# Patient Record
Sex: Female | Born: 1990
Health system: Southern US, Community
[De-identification: ages and names within clinical notes are randomized; demographics above are authoritative.]

## PROBLEM LIST (undated history)

## (undated) ENCOUNTER — Inpatient Hospital Stay (HOSPITAL_COMMUNITY): Payer: Self-pay

## (undated) ENCOUNTER — Emergency Department (HOSPITAL_COMMUNITY): Discharge status: Absent without leave | Payer: Self-pay

## (undated) DIAGNOSIS — A6000 Herpesviral infection of urogenital system, unspecified: Secondary | ICD-10-CM

## (undated) HISTORY — PX: BREAST SURGERY: SHX581

## (undated) HISTORY — PX: WISDOM TOOTH EXTRACTION: SHX21

## (undated) HISTORY — PX: BREAST ENHANCEMENT SURGERY: SHX7

---
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
## 2010-11-30 ENCOUNTER — Encounter: Payer: Self-pay | Admitting: *Deleted

## 2010-11-30 ENCOUNTER — Emergency Department (HOSPITAL_BASED_OUTPATIENT_CLINIC_OR_DEPARTMENT_OTHER)
Admission: EM | Admit: 2010-11-30 | Discharge: 2010-12-01 | Disposition: A | Discharge status: Home / Self Care | Payer: PRIVATE HEALTH INSURANCE | Attending: Emergency Medicine | Admitting: Emergency Medicine

## 2010-11-30 ENCOUNTER — Emergency Department (INDEPENDENT_AMBULATORY_CARE_PROVIDER_SITE_OTHER): Payer: PRIVATE HEALTH INSURANCE

## 2010-11-30 DIAGNOSIS — M545 Low back pain, unspecified: Secondary | ICD-10-CM | POA: Insufficient documentation

## 2010-11-30 DIAGNOSIS — M549 Dorsalgia, unspecified: Secondary | ICD-10-CM

## 2010-11-30 DIAGNOSIS — N12 Tubulo-interstitial nephritis, not specified as acute or chronic: Secondary | ICD-10-CM | POA: Insufficient documentation

## 2010-11-30 DIAGNOSIS — R109 Unspecified abdominal pain: Secondary | ICD-10-CM | POA: Insufficient documentation

## 2010-11-30 DIAGNOSIS — R1031 Right lower quadrant pain: Secondary | ICD-10-CM

## 2010-11-30 DIAGNOSIS — R110 Nausea: Secondary | ICD-10-CM

## 2010-11-30 LAB — DIFFERENTIAL
Basophils Relative: 0 % (ref 0–1)
Eosinophils Absolute: 0 10*3/uL (ref 0.0–0.7)
Lymphs Abs: 2 10*3/uL (ref 0.7–4.0)
Neutro Abs: 8.4 10*3/uL — ABNORMAL HIGH (ref 1.7–7.7)
Neutrophils Relative %: 74 % (ref 43–77)

## 2010-11-30 LAB — URINALYSIS, ROUTINE W REFLEX MICROSCOPIC
Bilirubin Urine: NEGATIVE
Leukocytes, UA: NEGATIVE
Nitrite: NEGATIVE
Specific Gravity, Urine: 1.022 (ref 1.005–1.030)
Urobilinogen, UA: 1 mg/dL (ref 0.0–1.0)
pH: 6 (ref 5.0–8.0)

## 2010-11-30 LAB — CBC
MCH: 32.9 pg (ref 26.0–34.0)
MCHC: 34.3 g/dL (ref 30.0–36.0)
Platelets: 225 10*3/uL (ref 150–400)
RBC: 4.1 MIL/uL (ref 3.87–5.11)

## 2010-11-30 LAB — PREGNANCY, URINE: Preg Test, Ur: NEGATIVE

## 2010-11-30 MED ORDER — SODIUM CHLORIDE 0.9 % IV BOLUS (SEPSIS)
1000.0000 mL | Freq: Once | INTRAVENOUS | Status: AC
  Administered 2010-11-30: 1000 mL via INTRAVENOUS

## 2010-11-30 MED ORDER — MORPHINE SULFATE 2 MG/ML IJ SOLN
2.0000 mg | Freq: Once | INTRAMUSCULAR | Status: AC
  Administered 2010-11-30: 2 mg via INTRAVENOUS
  Filled 2010-11-30: qty 1

## 2010-11-30 MED ORDER — ONDANSETRON HCL 4 MG/2ML IJ SOLN
4.0000 mg | Freq: Once | INTRAMUSCULAR | Status: AC
Start: 2010-11-30 — End: 2010-11-30
  Administered 2010-11-30: 4 mg via INTRAVENOUS
  Filled 2010-11-30: qty 2

## 2010-11-30 NOTE — ED Provider Notes (Addendum)
Scribed for Forbes Cellar, MD, the patient was seen in room MH04/MH04 . This chart was scribed by Ellie Lunch. This patient's care was started at 11:04 PM.   CSN: [id number] Arrival date & time: 11/30/2010 10:46 PM   First MD Initiated Contact with Patient 11/30/10 2304      Chief Complaint  Patient presents with  . Back Pain  . Abdominal Pain    (Consider location/radiation/quality/duration/timing/severity/associated sxs/prior treatment) HPI Donna [name] is a healthy  20 y.o. female who presents to the Emergency Department complaining of right lower back pain starting this morning. Pain described as becoming progressively worse and constant. Pain aggravated by movement and deep breaths. Pain rated 8/10 in severity. Pain does not radiate. Pt treated with Ibuprofen around 1p this afternoon and half hydrocodone (left over from breast augmentation) with no relief. Pt denies dysuria, hematuria, fever, chills, diarrhea, vaginal discharge and vomiting. Reports some nausea. Denies h/o similar symptoms. No recent trauma or heavy lifting. No incontinence or urinary retention. No hx of HIV or IV drug use. No history of cancer. Pt denies any chronic conditions. There are no other associated symptoms and no other alleviating or aggravating factors.   History reviewed. No pertinent past medical history.  Past Surgical History  Procedure Date  . Breast surgery     No family history on file.  History  Substance Use Topics  . Smoking status: Never Smoker   . Smokeless tobacco: Not on file  . Alcohol Use: 3.0 oz/week    6 drink(s) per week    Review of Systems 10 Systems reviewed and are negative for acute change except as noted in the HPI.   Allergies  Amoxicillin  Home Medications   Current Outpatient Rx  Name Route Sig Dispense Refill  . VALTREX PO Oral Take by mouth.      Marland Kitchen HYDROCODONE-ACETAMINOPHEN 7.5-500 MG PO TABS Oral Take 1 tablet by mouth every 6 (six) hours as  needed for pain. 15 tablet 0  . LEVOFLOXACIN 500 MG PO TABS Oral Take 1 tablet (500 mg total) by mouth daily. 5 tablet 0    BP 114/74  Pulse 95  Temp(Src) 99.2 F (37.3 C) (Oral)  Resp 20  Ht 5\' 5"  (1.651 m)  Wt 130 lb (58.968 kg)  BMI 21.63 kg/m2  SpO2 100%  LMP 11/26/2010  Physical Exam  Nursing note and vitals reviewed. Constitutional: She is oriented to person, place, and time. She appears well-developed and well-nourished.  HENT:  Head: Normocephalic and atraumatic.  Eyes: Conjunctivae are normal. No scleral icterus.  Neck: Normal range of motion. Neck supple.  Cardiovascular: Normal rate, regular rhythm and normal heart sounds.   Pulmonary/Chest: Effort normal and breath sounds normal.  Abdominal: Soft. She exhibits no mass. There is tenderness. There is no rebound and no guarding.       Tenderness RLQ/RUQ.  No CVAT  Musculoskeletal: Normal range of motion. She exhibits no tenderness.       No spinal/paraspinal tenderness to palpation on exam.  Pain Right straight leg raise.   Neurological: She is alert and oriented to person, place, and time.  Skin: Skin is warm and dry.  Psychiatric: She has a normal mood and affect.   Procedures (including critical care time)  OTHER DATA REVIEWED: Nursing notes, vital signs, and past medical records reviewed.  DIAGNOSTIC STUDIES: Oxygen Saturation is 100% on room air, normal by my interpretation.    LABS / RADIOLOGY:  Labs Reviewed  CBC - Abnormal;  Notable for the following:    WBC 11.3 (*)    RDW 11.3 (*)    All other components within normal limits  DIFFERENTIAL - Abnormal; Notable for the following:    Neutro Abs 8.4 (*)    All other components within normal limits  PREGNANCY, URINE  URINALYSIS, ROUTINE W REFLEX MICROSCOPIC  COMPREHENSIVE METABOLIC PANEL  LIPASE, BLOOD   Ct Abdomen Pelvis W Contrast  12/01/2010  *RADIOLOGY REPORT*  Clinical Data: Moderate right lower quadrant abdominal pain and back pain.  Nausea.   CT ABDOMEN AND PELVIS WITH CONTRAST  Technique:  Multidetector CT imaging of the abdomen and pelvis was performed following the standard protocol during bolus administration of intravenous contrast.  Contrast: OMNIPAQUE IOHEXOL 300 MG/ML IV SOLN  Comparison: None.  Findings: The visualized lung bases are clear.  Bilateral breast implants appear intact.  The liver and spleen are unremarkable in appearance.  The gallbladder is within normal limits.  The pancreas and adrenal glands are unremarkable.  A small 1.2 cm focus of decreased attenuation is noted extending to the periphery of the posterior right kidney.  Trace associated perinephric fluid is noted, raising question for very mild focal pyelonephritis.  The kidneys are otherwise unremarkable in appearance.  No significant perinephric stranding is seen.  There is no evidence of hydronephrosis.  No renal or ureteral stones are identified.  No free fluid is identified.  The small bowel is unremarkable in appearance.  The stomach is filled with contrast and is within normal limits.  No acute vascular abnormalities are seen.  The appendix normal in caliber and contains air, without evidence for appendicitis.  The colon is largely filled with stool and is unremarkable in appearance.  The bladder is moderately distended and grossly unremarkable in appearance.  The uterus remains normal in appearance.  The ovaries appear grossly symmetric, without suspicious adnexal mass.  No inguinal lymphadenopathy is seen.  No acute osseous abnormalities are identified.  IMPRESSION:  1.  Small focus of decreased attenuation extending to the periphery of the posterior right kidney, with trace associated perinephric fluid, raising question for very mild focal pyelonephritis. 2.  Otherwise unremarkable CT of the abdomen and pelvis.  Original Report Authenticated By: Tonia Ghent, M.D.    ED COURSE / COORDINATION OF CARE: 11:12 PMEDP at PT bedside. EDP discussed Plan to CT abd  to evaluate for possible infection incl appendicitis and cholecystitis. IVF, zofran, morphine. Reassess.  ED MEDICATIONS  Medications  sodium chloride 0.9 % bolus 1,000 mL (1000 mL Intravenous Given 11/30/10 2332)  ondansetron (ZOFRAN) injection 4 mg (4 mg Intravenous Given 11/30/10 2332)  morphine 2 MG/ML injection 2 mg (2 mg Intravenous Given 11/30/10 2332)    MDM:   Labs reviewed and remarkable for slightly elevated WBC. U/A and preg unremarkable. CT A/P pending.  Pt continues to c/o pain. Additional morphine ordered.  2:38 AM  Pt states pain improving. Home with levaquin and lortab. Precautions for return.  Diagnosis: Focal pyelonephritis  SCRIBE ATTESTATION: I personally performed the services described in this documentation, which was scribed in my presence. The recorded information has been reviewed and considered.   Stefano Gaul, MD   Forbes Cellar, MD 12/01/10 [id number]  Forbes Cellar, MD 12/01/10 1191  Forbes Cellar, MD 12/01/10 [phone]

## 2010-11-30 NOTE — ED Notes (Signed)
Pt c/o right side back pain since 1000- denies dysuria, denies vag d/c

## 2010-12-01 LAB — COMPREHENSIVE METABOLIC PANEL
ALT: 27 U/L (ref 0–35)
AST: 23 U/L (ref 0–37)
Albumin: 4 g/dL (ref 3.5–5.2)
Alkaline Phosphatase: 64 U/L (ref 39–117)
Chloride: 101 mEq/L (ref 96–112)
Potassium: 4.2 mEq/L (ref 3.5–5.1)
Sodium: 137 mEq/L (ref 135–145)
Total Bilirubin: 0.4 mg/dL (ref 0.3–1.2)
Total Protein: 7.7 g/dL (ref 6.0–8.3)

## 2010-12-01 MED ORDER — LEVOFLOXACIN 500 MG PO TABS: 500.0000 mg | ORAL_TABLET | Freq: Every day | ORAL | Status: AC

## 2010-12-01 MED ORDER — HYDROMORPHONE HCL 1 MG/ML IJ SOLN
1.0000 mg | Freq: Once | INTRAMUSCULAR | Status: AC
  Administered 2010-12-01: 1 mg via INTRAVENOUS
  Filled 2010-12-01: qty 1

## 2010-12-01 MED ORDER — LEVOFLOXACIN IN D5W 750 MG/150ML IV SOLN
750.0000 mg | INTRAVENOUS | Status: DC
  Filled 2010-12-01: qty 150

## 2010-12-01 MED ORDER — HYDROCODONE-ACETAMINOPHEN 7.5-500 MG PO TABS: 1.0000 | ORAL_TABLET | Freq: Four times a day (QID) | ORAL | Status: AC | PRN

## 2010-12-01 MED ORDER — IOHEXOL 300 MG/ML  SOLN
100.0000 mL | Freq: Once | INTRAMUSCULAR | Status: AC | PRN
  Administered 2010-12-01: 100 mL via INTRAVENOUS

## 2010-12-01 MED ORDER — MORPHINE SULFATE 4 MG/ML IJ SOLN
4.0000 mg | Freq: Once | INTRAMUSCULAR | Status: AC
  Administered 2010-12-01: 4 mg via INTRAVENOUS
  Filled 2010-12-01: qty 1

## 2011-06-27 ENCOUNTER — Ambulatory Visit (INDEPENDENT_AMBULATORY_CARE_PROVIDER_SITE_OTHER): Payer: PRIVATE HEALTH INSURANCE | Admitting: Physician Assistant

## 2011-06-27 VITALS — BP 105/71 | HR 78 | Temp 97.8°F | Resp 16 | Ht 65.5 in | Wt 138.0 lb

## 2011-06-27 DIAGNOSIS — J019 Acute sinusitis, unspecified: Secondary | ICD-10-CM

## 2011-06-27 DIAGNOSIS — J029 Acute pharyngitis, unspecified: Secondary | ICD-10-CM

## 2011-06-27 DIAGNOSIS — R05 Cough: Secondary | ICD-10-CM

## 2011-06-27 LAB — POCT CBC
Granulocyte percent: 44.9 %G (ref 37–80)
MCV: 97 fL (ref 80–97)
MID (cbc): 0.6 (ref 0–0.9)
MPV: 7.8 fL (ref 0–99.8)
POC Granulocyte: 2.6 (ref 2–6.9)
POC LYMPH PERCENT: 44.9 %L (ref 10–50)
POC MID %: 10.2 %M (ref 0–12)
Platelet Count, POC: 247 10*3/uL (ref 142–424)
RDW, POC: 12.2 %

## 2011-06-27 LAB — POCT RAPID STREP A (OFFICE): Rapid Strep A Screen: NEGATIVE

## 2011-06-27 MED ORDER — AZITHROMYCIN 250 MG PO TABS
ORAL_TABLET | ORAL | Status: AC
Start: 2011-06-27 — End: 2011-07-02

## 2011-06-27 MED ORDER — HYDROCODONE-HOMATROPINE 5-1.5 MG/5ML PO SYRP: ORAL_SOLUTION | ORAL | Status: AC

## 2011-06-27 MED ORDER — IPRATROPIUM BROMIDE 0.06 % NA SOLN: 2.0000 | Freq: Three times a day (TID) | NASAL | Status: DC

## 2011-06-27 NOTE — Progress Notes (Signed)
Patient ID: Donna [name] MRN: [id number], DOB: September 01, 1990, 21 y.o. Date of Encounter: 06/27/2011, 8:19 AM  Primary Physician: Mickie Hillier, MD, MD  Chief Complaint:  Chief Complaint  Patient presents with  . Sore Throat    wed night    HPI: 21 y.o. year old female presents with day a 3 history of sore throat, rhinorrhea, congestion, and cough. Subjective fever and chills. No sinus pressure, otalgia, or headache. Ears feel full. Normal hearing. No GI complaints. Able to swallow saliva, but hurts to do so. Decreased appetite secondary to sore throat. No sick contacts. No recent antibiotics. No sedentary periods, leg trauma, history of cancer, or tobacco use.   No past medical history on file.   Home Meds: Prior to Admission medications   Medication Sig Start Date End Date Taking? Authorizing Provider  levonorgestrel-ethinyl estradiol (NORDETTE) 0.15-30 MG-MCG tablet Take 1 tablet by mouth daily.   Yes Historical Provider, MD  ValACYclovir HCl (VALTREX PO) Take by mouth.      Historical Provider, MD    Allergies:  Allergies  Allergen Reactions  . Amoxicillin     History   Social History  . Marital Status: Single    Spouse Name: N/A    Number of Children: N/A  . Years of Education: N/A   Occupational History  . Not on file.   Social History Main Topics  . Smoking status: Never Smoker   . Smokeless tobacco: Not on file  . Alcohol Use: 3.0 oz/week    6 drink(s) per week  . Drug Use: No  . Sexually Active: Yes    Birth Control/ Protection: Pill   Other Topics Concern  . Not on file   Social History Narrative  . No narrative on file     Review of Systems: Constitutional: negative for chills, fever, night sweats or weight changes HEENT: see above Cardiovascular: negative for chest pain or palpitations Respiratory: negative for hemoptysis, wheezing, or shortness of breath Abdominal: negative for abdominal pain, nausea, vomiting or  diarrhea Dermatological: negative for rash Neurologic: negative for headache   Physical Exam: Blood pressure 105/71, pulse 78, temperature 97.8 F (36.6 C), temperature source Oral, resp. rate 16, height 5' 5.5" (1.664 m), weight 138 lb (62.596 kg), last menstrual period 06/20/2011., Body mass index is 22.61 kg/(m^2). General: Well developed, well nourished, in no acute distress. Head: Normocephalic, atraumatic, eyes without discharge, sclera non-icteric, nares are patent. Bilateral auditory canals clear, TM's are without perforation, pearly grey with reflective cone of light bilaterally. No sinus TTP. Oral cavity moist, dentition normal. Posterior pharynx with post nasal drip and mild erythema. Tonsils 2+ bilaterally. No peritonsillar abscess or tonsillar exudate. Neck: Supple. No thyromegaly. Full ROM. <2cm AC. Lungs: Clear bilaterally to auscultation without wheezes, rales, or rhonchi. Breathing is unlabored. Heart: RRR with S1 S2. No murmurs, rubs, or gallops appreciated. Msk:  Strength and tone normal for age. Extremities: No clubbing or cyanosis. No edema. Neuro: Alert and oriented X 3. Moves all extremities spontaneously. CNII-XII grossly in tact. Psych:  Responds to questions appropriately with a normal affect.   Labs: Results for orders placed in visit on 06/27/11  POCT CBC      Component Value Range   WBC 5.9  4.6 - 10.2 (K/uL)   Lymph, poc 2.6  0.6 - 3.4    POC LYMPH PERCENT 44.9  10 - 50 (%L)   MID (cbc) 0.6  0 - 0.9    POC MID % 10.2  0 - 12 (%M)   POC Granulocyte 2.6  2 - 6.9    Granulocyte percent 44.9  37 - 80 (%G)   RBC 4.28  4.04 - 5.48 (M/uL)   Hemoglobin 13.6  12.2 - 16.2 (g/dL)   HCT, POC 62.1  30.8 - 47.9 (%)   MCV 97.0  80 - 97 (fL)   MCH, POC 31.8 (*) 27 - 31.2 (pg)   MCHC 32.8  31.8 - 35.4 (g/dL)   RDW, POC 65.7     Platelet Count, POC 247  142 - 424 (K/uL)   MPV 7.8  0 - 99.8 (fL)  POCT RAPID STREP A (OFFICE)      Component Value Range   Rapid Strep A  Screen Negative  Negative    TC pending  ASSESSMENT AND PLAN:  21 y.o. year old female with early serous OM/pharyngitis/cough -Azithromycin 250 MG #6 2 po first day then 1 po next 4 days no RF -Hycodan #4oz 1 tsp po q 4-6 hours prn cough no RF SED -Atrovent NS 0.06% 2 sprays each nare bid prn #1 no RF -Tylenol/Motrin prn -Rest/fluids -RTC precautions -RTC 3-5 days if no improvement  Signed, Eula Listen, PA-C 06/27/2011 8:19 AM

## 2011-06-28 NOTE — Progress Notes (Signed)
Quick Note:  Await final results.  Eula Listen, PA-C 06/28/2011 5:15 PM ______

## 2011-06-30 LAB — CULTURE, GROUP A STREP

## 2011-06-30 NOTE — Progress Notes (Signed)
Quick Note:  Await final results.  Eula Listen, PA-C 06/30/2011 9:00 AM  ______

## 2011-11-17 ENCOUNTER — Ambulatory Visit (INDEPENDENT_AMBULATORY_CARE_PROVIDER_SITE_OTHER): Payer: PRIVATE HEALTH INSURANCE | Admitting: Family Medicine

## 2011-11-17 VITALS — BP 90/70 | HR 72 | Temp 98.2°F | Resp 17 | Ht 65.5 in | Wt 139.0 lb

## 2011-11-17 DIAGNOSIS — N912 Amenorrhea, unspecified: Secondary | ICD-10-CM

## 2011-11-17 NOTE — Progress Notes (Signed)
[facility] [address] [place] [address] [phone]  Date:  11/17/2011   Name:  Donna [name]   DOB:  1990-06-10   MRN:  [id number]  PCP:  Mickie Hillier, MD    Chief Complaint: Routine Prenatal Visit   History of Present Illness:  Donna [name] is a 21 y.o. very pleasant female patient who presents with the following:  She is here today to confirm pregnancy.  Her LMP was about 6 weeks ago, but she is not sure of the exact date. She took a home test about a week ago which was positive.  She does feel nauseated, but does not notice any other symptoms. She has not had any bleeding or pain.   She is pleased about the pregnancy.   This is her first pregnancy.   She is generally healthy, but does take valtrex a couple of times a week to prevent herpes outbreaks.    There is no problem list on file for this patient.   No past medical history on file.  Past Surgical History  Procedure Date  . Breast surgery     History  Substance Use Topics  . Smoking status: Never Smoker   . Smokeless tobacco: Not on file  . Alcohol Use: 3.0 oz/week    6 drink(s) per week    No family history on file.  Allergies  Allergen Reactions  . Amoxicillin     Medication list has been reviewed and updated.  Current Outpatient Prescriptions on File Prior to Visit  Medication Sig Dispense Refill  . ValACYclovir HCl (VALTREX PO) Take by mouth.        Marland Kitchen ipratropium (ATROVENT) 0.06 % nasal spray Place 2 sprays into the nose 3 (three) times daily.  15 mL  0  . levonorgestrel-ethinyl estradiol (NORDETTE) 0.15-30 MG-MCG tablet Take 1 tablet by mouth daily.        Review of Systems:  As per HPI- otherwise negative.   Physical Examination: Filed Vitals:   11/17/11 1200  BP: 126/78  Pulse: 72  Temp: 98.2 F (36.8 C)  Resp: 17   Filed Vitals:   11/17/11 1200  Height: 5' 5.5" (1.664 m)  Weight: 139 lb (63.05 kg)   Recheck BP about 90/ 70-  suspect she was nervous at first check  Body mass index is 22.78 kg/(m^2). Ideal Body Weight: Weight in (lb) to have BMI = 25: 152.2   GEN: WDWN, NAD, Non-toxic, A & O x 3, looks well HEENT: Atraumatic, Normocephalic. Neck supple. No masses, No LAD. Ears and Nose: No external deformity. CV: RRR, No M/G/R. No JVD. No thrill. No extra heart sounds. PULM: CTA B, no wheezes, crackles, rhonchi. No retractions. No resp. distress. No accessory muscle use. ABD: S, NT, ND, +BS. No rebound. No HSM. EXTR: No c/c/e NEURO Normal gait.  PSYCH: Normally interactive. Conversant. Not depressed or anxious appearing.  Calm demeanor.   Results for orders placed in visit on 11/17/11  POCT URINE PREGNANCY      Component Value Range   Preg Test, Ur Positive      Assessment and Plan: 1. Amenorrhea  POCT urine pregnancy   Positive pregnancy test.  Discussed with [name]- she is pleased with this news.  She has started taking PNV.  She can continue to use her valtrex- went over treatment of HSV in pregnancy with her.  Went over basic precautions such as avoidance of alcohol and tobacco, and avoidance of falls.  She has a pregnancy book.  She does plan to establish care at the [facility] as she is losing her current insurance within the next month or so.  Asked her to be sure to seek care if she has any bleeding or pain.   Abbe Amsterdam, MD

## 2011-11-17 NOTE — Patient Instructions (Addendum)
Congratulations!  Best of luck with your pregnancy.   If your last period was 10/05/11, your estimated due date would be 07/11/12.  You are just over [redacted] weeks along today.

## 2011-11-22 ENCOUNTER — Inpatient Hospital Stay (HOSPITAL_COMMUNITY)
Admission: AD | Admit: 2011-11-22 | Discharge: 2011-11-23 | Disposition: A | Discharge status: Home / Self Care | Payer: PRIVATE HEALTH INSURANCE | Source: Ambulatory Visit | Attending: Obstetrics and Gynecology | Admitting: Obstetrics and Gynecology

## 2011-11-22 ENCOUNTER — Encounter (HOSPITAL_COMMUNITY): Payer: Self-pay | Admitting: *Deleted

## 2011-11-22 ENCOUNTER — Inpatient Hospital Stay (HOSPITAL_COMMUNITY): Payer: PRIVATE HEALTH INSURANCE

## 2011-11-22 DIAGNOSIS — N760 Acute vaginitis: Secondary | ICD-10-CM | POA: Insufficient documentation

## 2011-11-22 DIAGNOSIS — A499 Bacterial infection, unspecified: Secondary | ICD-10-CM | POA: Insufficient documentation

## 2011-11-22 DIAGNOSIS — B9689 Other specified bacterial agents as the cause of diseases classified elsewhere: Secondary | ICD-10-CM | POA: Insufficient documentation

## 2011-11-22 DIAGNOSIS — R109 Unspecified abdominal pain: Secondary | ICD-10-CM | POA: Insufficient documentation

## 2011-11-22 DIAGNOSIS — N949 Unspecified condition associated with female genital organs and menstrual cycle: Secondary | ICD-10-CM | POA: Insufficient documentation

## 2011-11-22 DIAGNOSIS — O2390 Unspecified genitourinary tract infection in pregnancy, unspecified trimester: Secondary | ICD-10-CM | POA: Insufficient documentation

## 2011-11-22 HISTORY — DX: Herpesviral infection of urogenital system, unspecified: A60.00

## 2011-11-22 LAB — URINALYSIS, ROUTINE W REFLEX MICROSCOPIC
Bilirubin Urine: NEGATIVE
Hgb urine dipstick: NEGATIVE
Ketones, ur: NEGATIVE mg/dL
Specific Gravity, Urine: 1.025 (ref 1.005–1.030)
Urobilinogen, UA: 0.2 mg/dL (ref 0.0–1.0)

## 2011-11-22 LAB — CBC
Hemoglobin: 12.6 g/dL (ref 12.0–15.0)
MCHC: 34.8 g/dL (ref 30.0–36.0)
RDW: 11.4 % — ABNORMAL LOW (ref 11.5–15.5)

## 2011-11-22 LAB — WET PREP, GENITAL: Trich, Wet Prep: NONE SEEN

## 2011-11-22 MED ORDER — SIMETHICONE 80 MG PO CHEW
160.0000 mg | CHEWABLE_TABLET | Freq: Once | ORAL | Status: AC
  Administered 2011-11-23: 160 mg via ORAL
  Filled 2011-11-22: qty 2

## 2011-11-22 NOTE — MAU Provider Note (Signed)
History     CSN: [id number]  Arrival date and time: 11/22/11 2225   None     Chief Complaint  Patient presents with  . Abdominal Pain   HPI  Pt is here with report of lower pelvic pain x 1.5 weeks.  LMP unknown per pt (mid-late August) Pain is midpelvic in nature.   Seen in urgent care on 11/17/11 and diagnosed with pregnancy.  No report of vaginal bleeding or abnormal vaginal discharge.    Past Medical History  Diagnosis Date  . Genital HSV     Past Surgical History  Procedure Date  . Breast surgery     Augmentation 2010    History reviewed. No pertinent family history.  History  Substance Use Topics  . Smoking status: Never Smoker   . Smokeless tobacco: Not on file  . Alcohol Use: 3.0 oz/week    6 drink(s) per week    Allergies:  Allergies  Allergen Reactions  . Amoxicillin     Prescriptions prior to admission  Medication Sig Dispense Refill  . ipratropium (ATROVENT) 0.06 % nasal spray Place 2 sprays into the nose 3 (three) times daily.  15 mL  0  . levonorgestrel-ethinyl estradiol (NORDETTE) 0.15-30 MG-MCG tablet Take 1 tablet by mouth daily.      . ValACYclovir HCl (VALTREX PO) Take by mouth.          Review of Systems  Constitutional: Negative.   Gastrointestinal: Positive for nausea and abdominal pain (mid pelvic). Negative for vomiting and constipation (last bowel movement today).  Genitourinary: Negative.    Physical Exam   Blood pressure 117/70, pulse 82, temperature 98.4 F (36.9 C), temperature source Oral, resp. rate 18, height 5\' 5"  (1.651 m), weight 65.318 kg (144 lb), last menstrual period 09/24/2011.  Physical Exam  Constitutional: She is oriented to person, place, and time. She appears well-developed and well-nourished. No distress.  HENT:  Head: Normocephalic.  Neck: Normal range of motion. Neck supple.  Cardiovascular: Normal rate, regular rhythm and normal heart sounds.   Respiratory: Effort normal and breath sounds normal. No  respiratory distress.  GI: Soft. There is no tenderness. There is no CVA tenderness.  Genitourinary: Uterus is enlarged. Cervix exhibits no motion tenderness, no discharge and no friability. Right adnexum displays no mass. Left adnexum displays no mass. No bleeding around the vagina. Vaginal discharge (white, creamy) found.  Musculoskeletal: Normal range of motion.  Neurological: She is alert and oriented to person, place, and time.  Skin: Skin is warm and dry.  Psychiatric: She has a normal mood and affect.    MAU Course  Procedures  Results for orders placed during the hospital encounter of 11/22/11 (from the past 24 hour(s))  URINALYSIS, ROUTINE W REFLEX MICROSCOPIC     Status: Normal   Collection Time   11/22/11 10:30 PM      Component Value Range   Color, Urine YELLOW  YELLOW   APPearance CLEAR  CLEAR   Specific Gravity, Urine 1.025  1.005 - 1.030   pH 5.5  5.0 - 8.0   Glucose, UA NEGATIVE  NEGATIVE mg/dL   Hgb urine dipstick NEGATIVE  NEGATIVE   Bilirubin Urine NEGATIVE  NEGATIVE   Ketones, ur NEGATIVE  NEGATIVE mg/dL   Protein, ur NEGATIVE  NEGATIVE mg/dL   Urobilinogen, UA 0.2  0.0 - 1.0 mg/dL   Nitrite NEGATIVE  NEGATIVE   Leukocytes, UA NEGATIVE  NEGATIVE  POCT PREGNANCY, URINE     Status: Abnormal  Collection Time   11/22/11 10:40 PM      Component Value Range   Preg Test, Ur POSITIVE (*) NEGATIVE  WET PREP, GENITAL     Status: Abnormal   Collection Time   11/22/11 11:05 PM      Component Value Range   Yeast Wet Prep HPF POC NONE SEEN  NONE SEEN   Trich, Wet Prep NONE SEEN  NONE SEEN   Clue Cells Wet Prep HPF POC FEW (*) NONE SEEN   WBC, Wet Prep HPF POC MODERATE BACTERIA SEEN (*) NONE SEEN  CBC     Status: Abnormal   Collection Time   11/22/11 11:13 PM      Component Value Range   WBC 9.7  4.0 - 10.5 K/uL   RBC 3.81 (*) 3.87 - 5.11 MIL/uL   Hemoglobin 12.6  12.0 - 15.0 g/dL   HCT 16.1  09.6 - 04.5 %   MCV 95.0  78.0 - 100.0 fL   MCH 33.1  26.0 - 34.0 pg    MCHC 34.8  30.0 - 36.0 g/dL   RDW 40.9 (*) 81.1 - 91.4 %   Platelets 181  150 - 400 K/uL  HCG, QUANTITATIVE, PREGNANCY     Status: Abnormal   Collection Time   11/22/11 11:13 PM      Component Value Range   hCG, Beta Chain, Quant, S 78295 (*) <5 mIU/mL   Ultrasound: MPRESSION:  1. Single living intrauterine pregnancy measuring 7 weeks 4 days Gestation, +fetal heart rate seen. No subchorionic hemorrhage.  2. Trace free pelvic fluid.  3. The maternal right ovary cannot be visualized sonographically.  Assessment and Plan  Bacterial Vaginosis Intrauterine Pregnancy  Plan: DC to home RX Flagyl Begin prenatal care.  [facility] 11/22/2011, 10:56 PM

## 2011-11-22 NOTE — MAU Note (Signed)
Stomach pains and tenderness for the last two weeks. Patient states she can only lay on her back because laying on her side makes the pain worst. Its hard riding in the car because the seat belt causes pain. Dizziness and nausea however no vomiting.

## 2011-11-23 ENCOUNTER — Encounter (HOSPITAL_COMMUNITY): Payer: Self-pay | Admitting: *Deleted

## 2011-11-23 DIAGNOSIS — N760 Acute vaginitis: Secondary | ICD-10-CM

## 2011-11-23 DIAGNOSIS — A499 Bacterial infection, unspecified: Secondary | ICD-10-CM

## 2011-11-23 MED ORDER — METRONIDAZOLE 500 MG PO TABS: 500.0000 mg | ORAL_TABLET | Freq: Two times a day (BID) | ORAL | Status: DC

## 2011-11-24 LAB — GC/CHLAMYDIA PROBE AMP, GENITAL
Chlamydia, DNA Probe: NEGATIVE
GC Probe Amp, Genital: NEGATIVE

## 2011-12-17 ENCOUNTER — Other Ambulatory Visit (HOSPITAL_COMMUNITY): Payer: Self-pay | Admitting: Gynecology

## 2011-12-17 DIAGNOSIS — Z3689 Encounter for other specified antenatal screening: Secondary | ICD-10-CM

## 2011-12-17 DIAGNOSIS — Z3682 Encounter for antenatal screening for nuchal translucency: Secondary | ICD-10-CM

## 2011-12-19 ENCOUNTER — Encounter (HOSPITAL_COMMUNITY): Payer: Self-pay | Admitting: Physician Assistant

## 2011-12-24 ENCOUNTER — Encounter (HOSPITAL_COMMUNITY): Payer: Self-pay

## 2011-12-24 ENCOUNTER — Ambulatory Visit (HOSPITAL_COMMUNITY)
Admission: RE | Admit: 2011-12-24 | Discharge: 2011-12-24 | Disposition: A | Discharge status: Home / Self Care | Payer: PRIVATE HEALTH INSURANCE | Source: Ambulatory Visit | Attending: Physician Assistant | Admitting: Physician Assistant

## 2011-12-24 ENCOUNTER — Other Ambulatory Visit: Payer: Self-pay

## 2011-12-24 DIAGNOSIS — O3510X0 Maternal care for (suspected) chromosomal abnormality in fetus, unspecified, not applicable or unspecified: Secondary | ICD-10-CM | POA: Insufficient documentation

## 2011-12-24 DIAGNOSIS — O351XX0 Maternal care for (suspected) chromosomal abnormality in fetus, not applicable or unspecified: Secondary | ICD-10-CM | POA: Insufficient documentation

## 2011-12-24 DIAGNOSIS — Z3689 Encounter for other specified antenatal screening: Secondary | ICD-10-CM | POA: Insufficient documentation

## 2011-12-24 DIAGNOSIS — Z3682 Encounter for antenatal screening for nuchal translucency: Secondary | ICD-10-CM

## 2011-12-24 NOTE — Progress Notes (Signed)
Donna [name]  was seen today for an ultrasound appointment.  See full report in [facility].  Impression: Single IUP at 12 1/7 weeks NT of 1.4 mm noted.  Nasal bone visualized. First trimester aneuploidy screen performed as noted above.  Please do not draw triple/quad screen, though patient should be offered MSAFP for neural tube defect screening.  Recommendations: Recommend follow-up ultrasound examination for fetal anatomy at approximately [redacted] weeks gestation.  Alpha Gula, MD

## 2012-01-26 LAB — OB RESULTS CONSOLE HIV ANTIBODY (ROUTINE TESTING): HIV: NONREACTIVE

## 2012-01-26 LAB — OB RESULTS CONSOLE ABO/RH

## 2012-01-26 LAB — OB RESULTS CONSOLE ANTIBODY SCREEN: Antibody Screen: NEGATIVE

## 2012-01-26 LAB — OB RESULTS CONSOLE RUBELLA ANTIBODY, IGM: Rubella: IMMUNE

## 2012-01-26 LAB — OB RESULTS CONSOLE HEPATITIS B SURFACE ANTIGEN: Hepatitis B Surface Ag: NEGATIVE

## 2012-01-28 ENCOUNTER — Ambulatory Visit (HOSPITAL_COMMUNITY)
Admission: RE | Admit: 2012-01-28 | Discharge: 2012-01-28 | Disposition: A | Discharge status: Home / Self Care | Payer: Medicaid Other | Source: Ambulatory Visit | Attending: Gynecology | Admitting: Gynecology

## 2012-01-28 DIAGNOSIS — Z1389 Encounter for screening for other disorder: Secondary | ICD-10-CM | POA: Insufficient documentation

## 2012-01-28 DIAGNOSIS — Z3689 Encounter for other specified antenatal screening: Secondary | ICD-10-CM

## 2012-01-28 DIAGNOSIS — Z363 Encounter for antenatal screening for malformations: Secondary | ICD-10-CM | POA: Insufficient documentation

## 2012-01-28 DIAGNOSIS — O358XX0 Maternal care for other (suspected) fetal abnormality and damage, not applicable or unspecified: Secondary | ICD-10-CM | POA: Insufficient documentation

## 2012-02-11 NOTE — L&D Delivery Note (Signed)
Delivery Note At 7:14 PM a viable and healthy female was delivered via Vaginal, Spontaneous Delivery (Presentation: Left Occiput Anterior).  APGAR: 7, 8; weight 8 lb 3.9 oz (3740 g).   Placenta status: Intact, Spontaneous.  Cord: 3 vessels with single nuchal loop.    Anesthesia: Epidural  Episiotomy: None Lacerations: 1st degree;Vaginal Suture Repair: 3.0 chromic  Est. Blood Loss (mL): 600.  PPH noted.  Uterine message and exploration; or cytotec per rectum, Methergine 0.2 mg IM.  Heavy bleeding persisted for over an hour despite these interventions.  Bakri balloon placed (300 ml volume).  PPH protocol initiated (T&C for 2 units).  No further bleeding noted.  Patient is comfortable.  Urine output is good.  Pulse 110, BP 120/80 ranage.  Will observe in ICU tonight and remove the balloon in the AM if stable.  Mom to postpartum.  Baby to nursery-stable.  Mickel Baas 07/13/2012, 9:23 PM

## 2012-05-30 ENCOUNTER — Inpatient Hospital Stay (HOSPITAL_COMMUNITY)
Admission: AD | Admit: 2012-05-30 | Discharge: 2012-05-31 | Disposition: A | Discharge status: Home / Self Care | Payer: Medicaid Other | Source: Ambulatory Visit | Attending: Obstetrics and Gynecology | Admitting: Obstetrics and Gynecology

## 2012-05-30 ENCOUNTER — Encounter (HOSPITAL_COMMUNITY): Payer: Self-pay | Admitting: *Deleted

## 2012-05-30 DIAGNOSIS — N390 Urinary tract infection, site not specified: Secondary | ICD-10-CM | POA: Insufficient documentation

## 2012-05-30 DIAGNOSIS — O4703 False labor before 37 completed weeks of gestation, third trimester: Secondary | ICD-10-CM

## 2012-05-30 DIAGNOSIS — N949 Unspecified condition associated with female genital organs and menstrual cycle: Secondary | ICD-10-CM | POA: Insufficient documentation

## 2012-05-30 DIAGNOSIS — O2390 Unspecified genitourinary tract infection in pregnancy, unspecified trimester: Secondary | ICD-10-CM | POA: Insufficient documentation

## 2012-05-30 DIAGNOSIS — O4700 False labor before 37 completed weeks of gestation, unspecified trimester: Secondary | ICD-10-CM | POA: Insufficient documentation

## 2012-05-30 DIAGNOSIS — O2343 Unspecified infection of urinary tract in pregnancy, third trimester: Secondary | ICD-10-CM

## 2012-05-30 LAB — URINE MICROSCOPIC-ADD ON

## 2012-05-30 LAB — URINALYSIS, ROUTINE W REFLEX MICROSCOPIC
Glucose, UA: NEGATIVE mg/dL
Specific Gravity, Urine: 1.025 (ref 1.005–1.030)
Urobilinogen, UA: 0.2 mg/dL (ref 0.0–1.0)

## 2012-05-30 NOTE — MAU Note (Signed)
Pelvic pressure

## 2012-05-30 NOTE — MAU Note (Signed)
Pt. Started having sharp pains on her lower left abdomen.  Pt. Would have the pain for an hour then it went away and then came back again.

## 2012-05-30 NOTE — MAU Provider Note (Signed)
Chief Complaint:  Pelvic Pain  First Provider Initiated Contact with Patient 05/31/12 0028     HPI: Donna [name] is a 22 y.o. G1P0 at [redacted]w[redacted]d who presents to maternity admissions reporting pelvic pressure. Unsure if she is having contractions.  Denies leakage of fluid or vaginal bleeding. Good fetal movement.   Past Medical History: Past Medical History  Diagnosis Date  . Genital HSV     Past obstetric history: OB History   Grav Para Term Preterm Abortions TAB SAB Ect Mult Living   1              # Outc Date GA Lbr Len/2nd Wgt Sex Del Anes PTL Lv   1 CUR               Past Surgical History: Past Surgical History  Procedure Laterality Date  . Breast surgery      Augmentation 2010    Family History: History reviewed. No pertinent family history.  Social History: History  Substance Use Topics  . Smoking status: Never Smoker   . Smokeless tobacco: Not on file  . Alcohol Use: No    Allergies:  Allergies  Allergen Reactions  . Amoxicillin Rash    Meds:  No prescriptions prior to admission    ROS: Pos for urgency. Neg for dysuria, frequency, GI complaints.   Physical Exam  Blood pressure 126/60, pulse 92, temperature 98.1 F (36.7 C), temperature source Oral, resp. rate 18, height 5\' 5"  (1.651 m), weight 80.343 kg (177 lb 2 oz), last menstrual period 09/24/2011, SpO2 100.00%. GENERAL: Well-developed, well-nourished female in no acute distress.  HEENT: normocephalic HEART: normal rate RESP: normal effort ABDOMEN: Soft, non-tender, gravid appropriate for gestational age EXTREMITIES: Nontender, no edema NEURO: alert and oriented SPECULUM EXAM: NEFG, physiologic discharge, no blood, cervix clean. No lesion. Dilation: Closed Effacement (%): 50 Exam by:: Dorathy Kinsman CNM  FHT:  Baseline 150 , moderate variability, accelerations present, no decelerations Contractions: q 4-6 mins   Labs: Results for orders placed during the hospital encounter of  05/30/12 (from the past 24 hour(s))  URINALYSIS, ROUTINE W REFLEX MICROSCOPIC     Status: Abnormal   Collection Time    05/30/12  9:05 PM      Result Value Range   Color, Urine YELLOW  YELLOW   APPearance CLEAR  CLEAR   Specific Gravity, Urine 1.025  1.005 - 1.030   pH 6.0  5.0 - 8.0   Glucose, UA NEGATIVE  NEGATIVE mg/dL   Hgb urine dipstick MODERATE (*) NEGATIVE   Bilirubin Urine NEGATIVE  NEGATIVE   Ketones, ur NEGATIVE  NEGATIVE mg/dL   Protein, ur NEGATIVE  NEGATIVE mg/dL   Urobilinogen, UA 0.2  0.0 - 1.0 mg/dL   Nitrite NEGATIVE  NEGATIVE   Leukocytes, UA TRACE (*) NEGATIVE  URINE MICROSCOPIC-ADD ON     Status: Abnormal   Collection Time    05/30/12  9:05 PM      Result Value Range   Squamous Epithelial / LPF FEW (*) RARE   WBC, UA 3-6  <3 WBC/hpf   RBC / HPF 11-20  <3 RBC/hpf   Bacteria, UA FEW (*) RARE   Urine-Other RARE YEAST      Imaging:  No results found.  MAU Course: UC's resolved w/ Procardia.   Assessment: 1. Preterm uterine contractions, third trimester   2. UTI in pregnancy, third trimester    Plan: Discharge home Preterm labor precautions and fetal kick counts. Increase fluids and  rest. Pelvic rest x 1 week.      Follow-up Information   Follow up with HORVATH,MICHELLE A, MD. (As needed (Keep upcoming appt.))    Contact information:   [address] [place] [address] [phone]        Medication List    TAKE these medications       acetaminophen 325 MG tablet  Commonly known as:  TYLENOL  Take 650 mg by mouth every 6 (six) hours as needed for pain.     calcium carbonate 500 MG chewable tablet  Commonly known as:  TUMS - dosed in mg elemental calcium  Chew 1 tablet by mouth daily as needed for heartburn.     nitrofurantoin (macrocrystal-monohydrate) 100 MG capsule  Commonly known as:  MACROBID  Take 1 capsule (100 mg total) by mouth 2 (two) times daily.     prenatal multivitamin Tabs  Take 1 tablet by mouth  daily at 12 noon.     valACYclovir 500 MG tablet  Commonly known as:  VALTREX  Take 500 mg by mouth daily.        [place], CNM 05/31/2012 1:08 AM

## 2012-05-31 DIAGNOSIS — O479 False labor, unspecified: Secondary | ICD-10-CM

## 2012-05-31 MED ORDER — NITROFURANTOIN MONOHYD MACRO 100 MG PO CAPS: 100.0000 mg | ORAL_CAPSULE | Freq: Two times a day (BID) | ORAL | Status: AC

## 2012-05-31 MED ORDER — NIFEDIPINE 10 MG PO CAPS
10.0000 mg | ORAL_CAPSULE | ORAL | Status: DC | PRN
  Administered 2012-05-31: 10 mg via ORAL
  Filled 2012-05-31: qty 1

## 2012-06-01 LAB — URINE CULTURE: Colony Count: 80000

## 2012-06-09 ENCOUNTER — Other Ambulatory Visit: Payer: Self-pay | Admitting: Obstetrics and Gynecology

## 2012-06-11 LAB — OB RESULTS CONSOLE GC/CHLAMYDIA
Chlamydia: NEGATIVE
Gonorrhea: NEGATIVE

## 2012-07-07 ENCOUNTER — Inpatient Hospital Stay (HOSPITAL_COMMUNITY)
Admission: AD | Admit: 2012-07-07 | Discharge: 2012-07-07 | Disposition: A | Discharge status: Home / Self Care | Payer: Medicaid Other | Source: Ambulatory Visit | Attending: Obstetrics & Gynecology | Admitting: Obstetrics & Gynecology

## 2012-07-07 ENCOUNTER — Encounter (HOSPITAL_COMMUNITY): Payer: Self-pay | Admitting: *Deleted

## 2012-07-07 DIAGNOSIS — M545 Low back pain, unspecified: Secondary | ICD-10-CM | POA: Insufficient documentation

## 2012-07-07 DIAGNOSIS — O479 False labor, unspecified: Secondary | ICD-10-CM | POA: Insufficient documentation

## 2012-07-07 MED ORDER — IBUPROFEN 600 MG PO TABS
600.0000 mg | ORAL_TABLET | Freq: Once | ORAL | Status: AC
  Administered 2012-07-07: 600 mg via ORAL
  Filled 2012-07-07: qty 1

## 2012-07-07 MED ORDER — IBUPROFEN 400 MG PO TABS: 400.0000 mg | ORAL_TABLET | Freq: Once | ORAL | Status: DC

## 2012-07-07 NOTE — Progress Notes (Signed)
Monitors off for discharge, Paitent and spouse very unhappy. Patient states being frustrated about contractions and lower back pain.  Labor education done and s/s to return to the hospital for and emotional support given. Awaiting motrin from pharmacy for back pain.

## 2012-07-07 NOTE — MAU Note (Signed)
Pt reports contractions q 5-6 minutes all day today. Denies bleeding or ROM. Reports good fetal movement.

## 2012-07-07 NOTE — Discharge Instructions (Signed)

## 2012-07-12 ENCOUNTER — Inpatient Hospital Stay (HOSPITAL_COMMUNITY)
Admission: AD | Admit: 2012-07-12 | Discharge: 2012-07-15 | DRG: 774 | Disposition: A | Discharge status: Home / Self Care | Payer: PRIVATE HEALTH INSURANCE | Source: Ambulatory Visit | Attending: Obstetrics and Gynecology | Admitting: Obstetrics and Gynecology

## 2012-07-12 ENCOUNTER — Inpatient Hospital Stay (HOSPITAL_COMMUNITY)
Admission: AD | Admit: 2012-07-12 | Discharge: 2012-07-12 | Disposition: A | Discharge status: Home / Self Care | Payer: Medicaid Other | Source: Ambulatory Visit | Attending: Obstetrics and Gynecology | Admitting: Obstetrics and Gynecology

## 2012-07-12 ENCOUNTER — Encounter (HOSPITAL_COMMUNITY): Payer: Self-pay | Admitting: *Deleted

## 2012-07-12 DIAGNOSIS — O480 Post-term pregnancy: Principal | ICD-10-CM | POA: Diagnosis present

## 2012-07-12 DIAGNOSIS — O479 False labor, unspecified: Secondary | ICD-10-CM | POA: Insufficient documentation

## 2012-07-12 LAB — CBC
MCHC: 35 g/dL (ref 30.0–36.0)
RDW: 12.6 % (ref 11.5–15.5)

## 2012-07-12 MED ORDER — ONDANSETRON HCL 4 MG/2ML IJ SOLN: 4.0000 mg | Freq: Four times a day (QID) | INTRAMUSCULAR | Status: DC | PRN

## 2012-07-12 MED ORDER — OXYCODONE-ACETAMINOPHEN 5-325 MG PO TABS: 1.0000 | ORAL_TABLET | ORAL | Status: DC | PRN

## 2012-07-12 MED ORDER — OXYTOCIN BOLUS FROM INFUSION: 500.0000 mL | INTRAVENOUS | Status: DC

## 2012-07-12 MED ORDER — ZOLPIDEM TARTRATE 5 MG PO TABS
5.0000 mg | ORAL_TABLET | Freq: Every evening | ORAL | Status: DC | PRN
  Administered 2012-07-13: 5 mg via ORAL
  Filled 2012-07-12: qty 1

## 2012-07-12 MED ORDER — TERBUTALINE SULFATE 1 MG/ML IJ SOLN: 0.2500 mg | Freq: Once | INTRAMUSCULAR | Status: AC | PRN

## 2012-07-12 MED ORDER — LIDOCAINE HCL (PF) 1 % IJ SOLN
30.0000 mL | INTRAMUSCULAR | Status: DC | PRN
  Filled 2012-07-12: qty 30

## 2012-07-12 MED ORDER — ACETAMINOPHEN 325 MG PO TABS: 650.0000 mg | ORAL_TABLET | ORAL | Status: DC | PRN

## 2012-07-12 MED ORDER — LACTATED RINGERS IV SOLN
500.0000 mL | INTRAVENOUS | Status: DC | PRN
  Administered 2012-07-13: 1000 mL via INTRAVENOUS

## 2012-07-12 MED ORDER — IBUPROFEN 600 MG PO TABS
600.0000 mg | ORAL_TABLET | Freq: Four times a day (QID) | ORAL | Status: DC | PRN
  Administered 2012-07-13: 600 mg via ORAL
  Filled 2012-07-12: qty 1

## 2012-07-12 MED ORDER — MISOPROSTOL 25 MCG QUARTER TABLET
25.0000 ug | ORAL_TABLET | ORAL | Status: DC | PRN
  Administered 2012-07-12 – 2012-07-13 (×2): 25 ug via VAGINAL
  Filled 2012-07-12: qty 1
  Filled 2012-07-12 (×2): qty 0.25

## 2012-07-12 MED ORDER — LACTATED RINGERS IV SOLN
INTRAVENOUS | Status: DC
  Administered 2012-07-13: 1000 mL via INTRAVENOUS
  Administered 2012-07-13: 02:00:00 via INTRAVENOUS
  Administered 2012-07-13: 400 mL via INTRAVENOUS
  Administered 2012-07-13: 22:00:00 via INTRAVENOUS

## 2012-07-12 MED ORDER — BUTORPHANOL TARTRATE 1 MG/ML IJ SOLN: 1.0000 mg | INTRAMUSCULAR | Status: DC | PRN

## 2012-07-12 MED ORDER — OXYTOCIN 40 UNITS IN LACTATED RINGERS INFUSION - SIMPLE MED
62.5000 mL/h | INTRAVENOUS | Status: DC
  Administered 2012-07-13: 62.5 mL/h via INTRAVENOUS
  Filled 2012-07-12: qty 1000

## 2012-07-12 MED ORDER — CITRIC ACID-SODIUM CITRATE 334-500 MG/5ML PO SOLN: 30.0000 mL | ORAL | Status: DC | PRN

## 2012-07-12 MED ORDER — OXYTOCIN 40 UNITS IN LACTATED RINGERS INFUSION - SIMPLE MED: 1.0000 m[IU]/min | INTRAVENOUS | Status: DC

## 2012-07-12 NOTE — MAU Note (Signed)
Pt c/o contractions that brought her to tears for the last hour and a half.  Denies any bleeding and says she has had some wetness in her underware for the past couple of days.

## 2012-07-12 NOTE — MAU Note (Signed)
Pt reports having ctx that feel stronger about 4 min aprts. Reports she has lost mucus plug and good fetal  Movement. 2cm/70 last week in office.

## 2012-07-13 ENCOUNTER — Encounter (HOSPITAL_COMMUNITY): Payer: Self-pay | Admitting: *Deleted

## 2012-07-13 ENCOUNTER — Encounter (HOSPITAL_COMMUNITY): Payer: Self-pay | Admitting: Anesthesiology

## 2012-07-13 ENCOUNTER — Inpatient Hospital Stay (HOSPITAL_COMMUNITY): Payer: PRIVATE HEALTH INSURANCE | Admitting: Anesthesiology

## 2012-07-13 LAB — DIC (DISSEMINATED INTRAVASCULAR COAGULATION)PANEL
Fibrinogen: 547 mg/dL — ABNORMAL HIGH (ref 204–475)
INR: 0.96 (ref 0.00–1.49)
Platelets: 178 10*3/uL (ref 150–400)
Smear Review: NONE SEEN
aPTT: 36 seconds (ref 24–37)

## 2012-07-13 LAB — CBC
Hemoglobin: 11 g/dL — ABNORMAL LOW (ref 12.0–15.0)
MCH: 33.5 pg (ref 26.0–34.0)
MCV: 94.8 fL (ref 78.0–100.0)
RBC: 3.28 MIL/uL — ABNORMAL LOW (ref 3.87–5.11)

## 2012-07-13 LAB — PREPARE RBC (CROSSMATCH)

## 2012-07-13 MED ORDER — FENTANYL 2.5 MCG/ML BUPIVACAINE 1/10 % EPIDURAL INFUSION (WH - ANES)
14.0000 mL/h | INTRAMUSCULAR | Status: DC | PRN
  Administered 2012-07-13 (×2): 14 mL/h via EPIDURAL
  Filled 2012-07-13 (×2): qty 125

## 2012-07-13 MED ORDER — MISOPROSTOL 200 MCG PO TABS: 800.0000 ug | ORAL_TABLET | Freq: Once | ORAL | Status: DC

## 2012-07-13 MED ORDER — MISOPROSTOL 200 MCG PO TABS
ORAL_TABLET | ORAL | Status: AC
  Administered 2012-07-13: 800 ug
  Filled 2012-07-13: qty 4

## 2012-07-13 MED ORDER — PHENYLEPHRINE 40 MCG/ML (10ML) SYRINGE FOR IV PUSH (FOR BLOOD PRESSURE SUPPORT)
80.0000 ug | PREFILLED_SYRINGE | INTRAVENOUS | Status: DC | PRN
  Filled 2012-07-13: qty 5
  Filled 2012-07-13: qty 2

## 2012-07-13 MED ORDER — TERBUTALINE SULFATE 1 MG/ML IJ SOLN: 0.2500 mg | Freq: Once | INTRAMUSCULAR | Status: DC | PRN

## 2012-07-13 MED ORDER — DIPHENHYDRAMINE HCL 25 MG PO CAPS: 25.0000 mg | ORAL_CAPSULE | Freq: Four times a day (QID) | ORAL | Status: DC | PRN

## 2012-07-13 MED ORDER — PRENATAL MULTIVITAMIN CH
1.0000 | ORAL_TABLET | Freq: Every day | ORAL | Status: DC
  Administered 2012-07-14: 1 via ORAL
  Filled 2012-07-13: qty 1

## 2012-07-13 MED ORDER — METHYLERGONOVINE MALEATE 0.2 MG PO TABS: 0.2000 mg | ORAL_TABLET | ORAL | Status: DC | PRN

## 2012-07-13 MED ORDER — METHYLERGONOVINE MALEATE 0.2 MG/ML IJ SOLN
INTRAMUSCULAR | Status: AC
  Administered 2012-07-13: 0.2 mg via INTRAMUSCULAR
  Filled 2012-07-13: qty 1

## 2012-07-13 MED ORDER — ZOLPIDEM TARTRATE 5 MG PO TABS: 5.0000 mg | ORAL_TABLET | Freq: Every evening | ORAL | Status: DC | PRN

## 2012-07-13 MED ORDER — BENZOCAINE-MENTHOL 20-0.5 % EX AERO
1.0000 "application " | INHALATION_SPRAY | CUTANEOUS | Status: DC | PRN
  Administered 2012-07-14: 1 via TOPICAL
  Filled 2012-07-13 (×2): qty 56

## 2012-07-13 MED ORDER — OXYCODONE-ACETAMINOPHEN 5-325 MG PO TABS
1.0000 | ORAL_TABLET | ORAL | Status: DC | PRN
  Administered 2012-07-14 – 2012-07-15 (×3): 1 via ORAL
  Filled 2012-07-13 (×3): qty 1

## 2012-07-13 MED ORDER — TETANUS-DIPHTH-ACELL PERTUSSIS 5-2.5-18.5 LF-MCG/0.5 IM SUSP
0.5000 mL | Freq: Once | INTRAMUSCULAR | Status: AC
  Administered 2012-07-14: 0.5 mL via INTRAMUSCULAR
  Filled 2012-07-13: qty 0.5

## 2012-07-13 MED ORDER — LIDOCAINE HCL (PF) 1 % IJ SOLN
INTRAMUSCULAR | Status: DC | PRN
  Administered 2012-07-13 (×2): 5 mL

## 2012-07-13 MED ORDER — METHYLERGONOVINE MALEATE 0.2 MG/ML IJ SOLN: 0.2000 mg | Freq: Once | INTRAMUSCULAR | Status: DC

## 2012-07-13 MED ORDER — IBUPROFEN 600 MG PO TABS
600.0000 mg | ORAL_TABLET | Freq: Four times a day (QID) | ORAL | Status: DC
  Administered 2012-07-14 – 2012-07-15 (×5): 600 mg via ORAL
  Filled 2012-07-13 (×5): qty 1

## 2012-07-13 MED ORDER — ONDANSETRON HCL 4 MG PO TABS: 4.0000 mg | ORAL_TABLET | ORAL | Status: DC | PRN

## 2012-07-13 MED ORDER — LANOLIN HYDROUS EX OINT: TOPICAL_OINTMENT | CUTANEOUS | Status: DC | PRN

## 2012-07-13 MED ORDER — METHYLERGONOVINE MALEATE 0.2 MG/ML IJ SOLN: 0.2000 mg | INTRAMUSCULAR | Status: DC | PRN

## 2012-07-13 MED ORDER — EPHEDRINE 5 MG/ML INJ
10.0000 mg | INTRAVENOUS | Status: DC | PRN
  Filled 2012-07-13: qty 2

## 2012-07-13 MED ORDER — DIBUCAINE 1 % RE OINT: 1.0000 "application " | TOPICAL_OINTMENT | RECTAL | Status: DC | PRN

## 2012-07-13 MED ORDER — DIPHENHYDRAMINE HCL 50 MG/ML IJ SOLN: 12.5000 mg | INTRAMUSCULAR | Status: DC | PRN

## 2012-07-13 MED ORDER — PHENYLEPHRINE 40 MCG/ML (10ML) SYRINGE FOR IV PUSH (FOR BLOOD PRESSURE SUPPORT)
80.0000 ug | PREFILLED_SYRINGE | INTRAVENOUS | Status: DC | PRN
  Filled 2012-07-13: qty 2

## 2012-07-13 MED ORDER — FENTANYL CITRATE 0.05 MG/ML IJ SOLN
INTRAMUSCULAR | Status: AC
  Administered 2012-07-13: 100 ug
  Filled 2012-07-13: qty 2

## 2012-07-13 MED ORDER — WITCH HAZEL-GLYCERIN EX PADS
1.0000 "application " | MEDICATED_PAD | CUTANEOUS | Status: DC | PRN
  Administered 2012-07-14: 1 via TOPICAL

## 2012-07-13 MED ORDER — OXYTOCIN 40 UNITS IN LACTATED RINGERS INFUSION - SIMPLE MED
1.0000 m[IU]/min | INTRAVENOUS | Status: DC
  Administered 2012-07-13: 2 m[IU]/min via INTRAVENOUS
  Administered 2012-07-13: 4 m[IU]/min via INTRAVENOUS
  Filled 2012-07-13: qty 1000

## 2012-07-13 MED ORDER — SIMETHICONE 80 MG PO CHEW: 80.0000 mg | CHEWABLE_TABLET | ORAL | Status: DC | PRN

## 2012-07-13 MED ORDER — EPHEDRINE 5 MG/ML INJ
10.0000 mg | INTRAVENOUS | Status: DC | PRN
  Filled 2012-07-13: qty 4
  Filled 2012-07-13: qty 2

## 2012-07-13 MED ORDER — SENNOSIDES-DOCUSATE SODIUM 8.6-50 MG PO TABS: 2.0000 | ORAL_TABLET | Freq: Every day | ORAL | Status: DC

## 2012-07-13 MED ORDER — LACTATED RINGERS IV SOLN: 500.0000 mL | Freq: Once | INTRAVENOUS | Status: DC

## 2012-07-13 MED ORDER — ONDANSETRON HCL 4 MG/2ML IJ SOLN: 4.0000 mg | INTRAMUSCULAR | Status: DC | PRN

## 2012-07-13 NOTE — Anesthesia Preprocedure Evaluation (Signed)

## 2012-07-13 NOTE — Progress Notes (Signed)
Dr. Arlyce Dice called back to the patients room for evaluation of pts bleeding. PPH protocol initiated Dr. Arlyce Dice inserted a Bakari balloon with of fluid. Pts vital signs stable at this time, Rn will continue to monitor vitals and bleeding.

## 2012-07-13 NOTE — H&P (Addendum)
22 y.o. G1P0  Estimated Date of Delivery: 07/06/12 admitted at [redacted] weeks gestation for induction.  Prenatal Transfer Tool  Maternal Diabetes: No Genetic Screening: Normal Maternal Ultrasounds/Referrals: Normal Fetal Ultrasounds or other Referrals:  None Maternal Substance Abuse:  No Significant Maternal Medications:  Valtrex for HSV suppression Significant Maternal Lab Results: None Other Significant Pregnancy Complications:  H/o HSV  Afebrile, VSS Heart and Lungs: No active disease Abdomen: soft, gravid, EFW AGA. Cervical exam:  2/80  Impression: Post dates pregnancy  Plan:  Pitocin induction

## 2012-07-13 NOTE — Progress Notes (Signed)
AROM clear.  Cx: 3/90. -2.

## 2012-07-13 NOTE — Anesthesia Procedure Notes (Signed)
Epidural Patient location during procedure: OB Start time: 07/13/2012 10:40 AM  Staffing Anesthesiologist: Angus Seller., Harrell Gave. Performed by: anesthesiologist   Preanesthetic Checklist Completed: patient identified, site marked, surgical consent, pre-op evaluation, timeout performed, IV checked, risks and benefits discussed and monitors and equipment checked  Epidural Patient position: sitting Prep: site prepped and draped and DuraPrep Patient monitoring: continuous pulse ox and blood pressure Approach: midline Injection technique: LOR air and LOR saline  Needle:  Needle type: Tuohy  Needle gauge: 17 G Needle length: 9 cm and 9 Needle insertion depth: 5 cm cm Catheter type: closed end flexible Catheter size: 19 Gauge Catheter at skin depth: 10 cm Test dose: negative  Assessment Events: blood not aspirated, injection not painful, no injection resistance, negative IV test and no paresthesia  Additional Notes Patient identified.  Risk benefits discussed including failed block, incomplete pain control, headache, nerve damage, paralysis, blood pressure changes, nausea, vomiting, reactions to medication both toxic or allergic, and postpartum back pain.  Patient expressed understanding and wished to proceed.  All questions were answered.  Sterile technique used throughout procedure and epidural site dressed with sterile barrier dressing. No paresthesia or other complications noted.The patient did not experience any signs of intravascular injection such as tinnitus or metallic taste in mouth nor signs of intrathecal spread such as rapid motor block. Please see nursing notes for vital signs.

## 2012-07-14 ENCOUNTER — Encounter (HOSPITAL_COMMUNITY): Payer: Self-pay | Admitting: *Deleted

## 2012-07-14 LAB — CBC
Hemoglobin: 8.7 g/dL — ABNORMAL LOW (ref 12.0–15.0)
RBC: 2.63 MIL/uL — ABNORMAL LOW (ref 3.87–5.11)
WBC: 15.2 10*3/uL — ABNORMAL HIGH (ref 4.0–10.5)

## 2012-07-14 LAB — MRSA PCR SCREENING: MRSA by PCR: NEGATIVE

## 2012-07-14 NOTE — Anesthesia Postprocedure Evaluation (Signed)
Anesthesia Post Note  Patient: Donna [name]  Procedure(s) Performed: * No procedures listed *  Anesthesia type: Epidural  Patient location: Mother/Baby  Post pain: Pain level controlled  Post assessment: Post-op Vital signs reviewed  Last Vitals:  Filed Vitals:   07/14/12 1520  BP: 111/59  Pulse: 87  Temp: 36.6 C  Resp: 16    Post vital signs: Reviewed  Level of consciousness:alert  Complications: No apparent anesthesia complications

## 2012-07-14 NOTE — Progress Notes (Signed)
UR chart review completed.  

## 2012-07-14 NOTE — Progress Notes (Signed)
Patient is doing well, comfortable, but vulvar area is sore and very uncomfortable.  No CP/SOB.  Eating and drinking fine.  No calf pain.  No other complaints. Bakri with <100cc dark blood, removed.  Fundus firm, no bleeding with deep palpation. Vulva very swollen. Pt to be monitored in ICU for 4hrs, if minimal further bleeding will transfer to postpartum after foley catheter removed. Cont. Other routine care.

## 2012-07-15 ENCOUNTER — Inpatient Hospital Stay (HOSPITAL_COMMUNITY): Admission: RE | Admit: 2012-07-15 | Payer: PRIVATE HEALTH INSURANCE | Source: Ambulatory Visit

## 2012-07-15 LAB — TYPE AND SCREEN
ABO/RH(D): O POS
Antibody Screen: NEGATIVE
Unit division: 0

## 2012-07-15 MED ORDER — OXYCODONE-ACETAMINOPHEN 5-325 MG PO TABS: 1.0000 | ORAL_TABLET | ORAL | Status: DC | PRN

## 2012-07-15 NOTE — Discharge Summary (Signed)
Obstetric Discharge Summary Reason for Admission: induction of labor Prenatal Procedures: none Intrapartum Procedures: spontaneous vaginal delivery Postpartum Procedures: bakri balloon for pp hemorrhage Complications-Operative and Postpartum: 1st degree perineal laceration and hemorrhage Hemoglobin  Date Value Range Status  07/14/2012 8.7* 12.0 - 15.0 g/dL Final     DELTA CHECK NOTED     REPEATED TO VERIFY  06/27/2011 13.6  12.2 - 16.2 g/dL Final     HCT  Date Value Range Status  07/14/2012 24.9* 36.0 - 46.0 % Final     HCT, POC  Date Value Range Status  06/27/2011 41.5  37.7 - 47.9 % Final     Discharge Diagnoses: Term Pregnancy-delivered  Discharge Information: Date: 07/15/2012 Activity: pelvic rest Diet: routine Medications: Ibuprofen, Iron and Percocet Condition: stable Instructions: refer to practice specific booklet Discharge to: home Follow-up Information   Schedule an appointment as soon as possible for a visit with Mickel Baas, MD.   Contact information:   [address] [place] [address] [phone]       Newborn Data: Live born female  Birth Weight: 8 lb 3.9 oz (3739 g) APGAR: 7, 8  Home with mother.  Kalid Ghan A 07/15/2012, 7:59 AM

## 2012-07-15 NOTE — Progress Notes (Signed)
Patient is eating, ambulating, voiding.  Pain control is good.  No more bleeding.  Filed Vitals:   07/14/12 1300 07/14/12 1520 07/14/12 1800 07/15/12 0534  BP: 110/64 111/59 119/71 106/61  Pulse: 98 87 99 88  Temp:  97.8 F (36.6 C) 98 F (36.7 C) 98 F (36.7 C)  TempSrc:  Oral Oral Oral  Resp: 18 16 18 18   Height:      Weight:      SpO2: 99% 98%      Fundus firm Perineum without swelling.  Lab Results  Component Value Date   WBC 15.2* 07/14/2012   HGB 8.7* 07/14/2012   HCT 24.9* 07/14/2012   MCV 94.7 07/14/2012   PLT 162 07/14/2012    --/--/O POS (06/02 2043)/RI  A/P Post partum day 2, s/p pp hemorrhage but VSS and H/H good.  Routine care.  Expect d/c today.  Percocet for perineal pain if needed.  Teletha Petrea A

## 2013-09-07 LAB — OB RESULTS CONSOLE RUBELLA ANTIBODY, IGM: RUBELLA: NON-IMMUNE/NOT IMMUNE

## 2013-09-07 LAB — OB RESULTS CONSOLE ANTIBODY SCREEN: Antibody Screen: NEGATIVE

## 2013-09-07 LAB — OB RESULTS CONSOLE HIV ANTIBODY (ROUTINE TESTING)
HIV: NONREACTIVE
HIV: NONREACTIVE
HIV: NONREACTIVE

## 2013-09-07 LAB — OB RESULTS CONSOLE GC/CHLAMYDIA
Chlamydia: NEGATIVE
GC PROBE AMP, GENITAL: NEGATIVE

## 2013-09-07 LAB — OB RESULTS CONSOLE ABO/RH: RH TYPE: POSITIVE

## 2013-09-07 LAB — OB RESULTS CONSOLE HEPATITIS B SURFACE ANTIGEN: Hepatitis B Surface Ag: NEGATIVE

## 2013-09-07 LAB — OB RESULTS CONSOLE RPR: RPR: NONREACTIVE

## 2013-10-08 IMAGING — CT CT ABD-PELV W/ CM
2 of 4 series · 16 of 46 positions shown, 18 images · IV contrast (APPLIED)
Comparison: None.

CLINICAL DATA: Moderate right lower quadrant abdominal pain and
back pain.  Nausea.

CT ABDOMEN AND PELVIS WITH CONTRAST
TECHNIQUE: Multidetector CT imaging of the abdomen and pelvis was
performed following the standard protocol during bolus
administration of intravenous contrast.
Contrast: 100mL OMNIPAQUE IOHEXOL 300 MG/ML IV SOLN

[Series 3: abd/pelvis 5.0 b31f · axial · 0.70mm/px · z∈[-430,-35]mm · 13 of 91 slices shown, 15 images]
[im 4/91  soft-tissue]
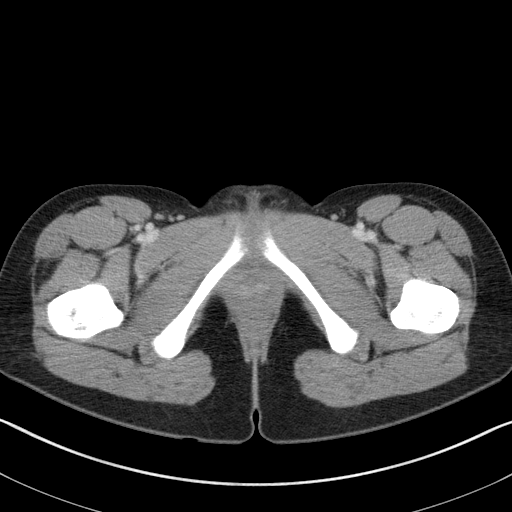
[im 4/91  bone]
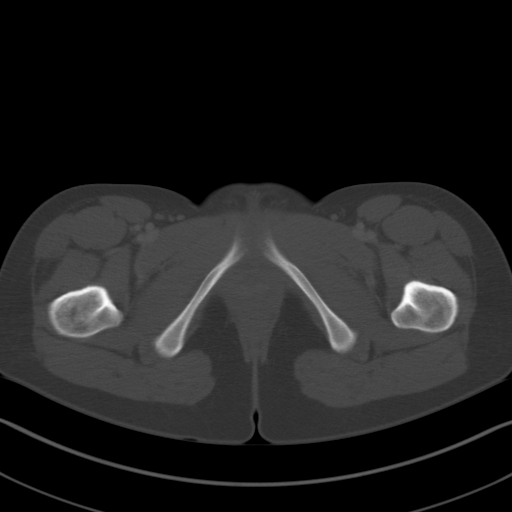
[im 11/91  soft-tissue]
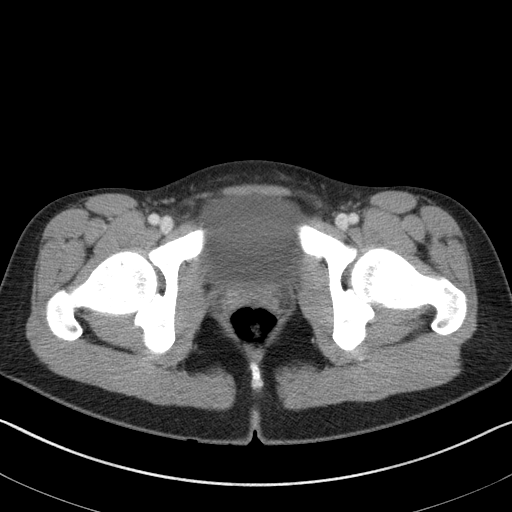
[im 19/91  soft-tissue]
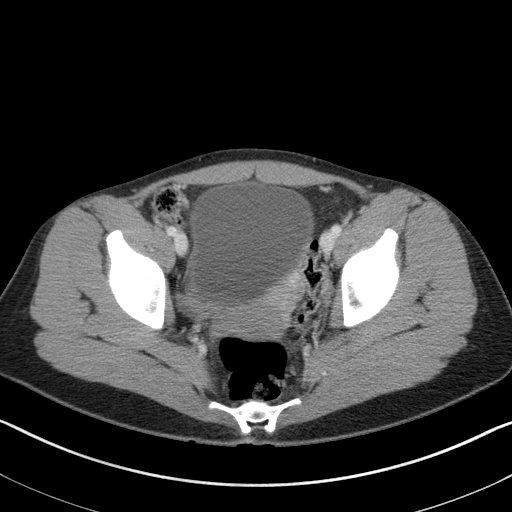
[im 26/91  soft-tissue]
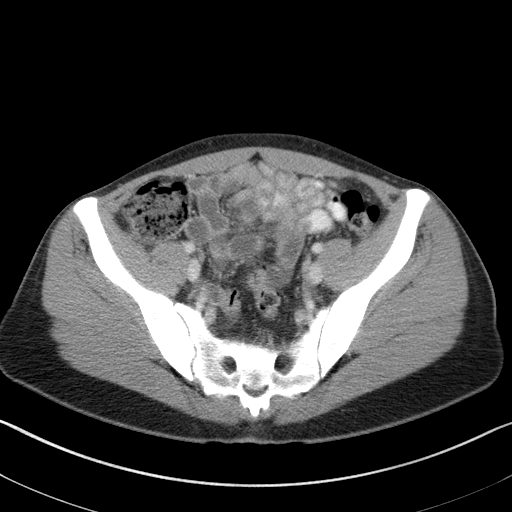
[im 33/91  soft-tissue]
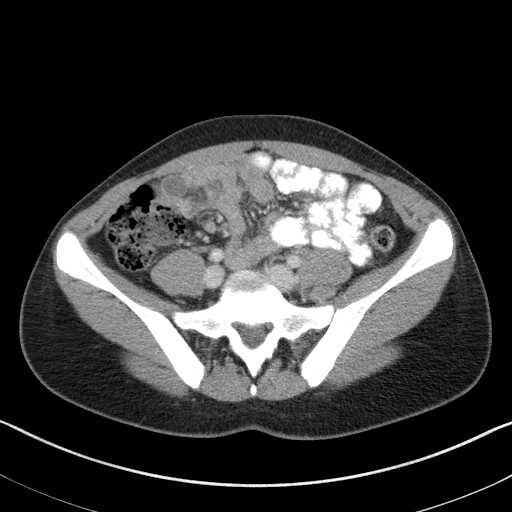
[im 40/91  soft-tissue]
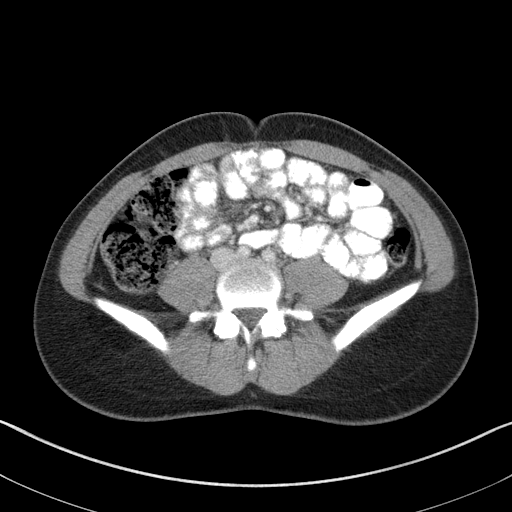
[im 47/91  soft-tissue]
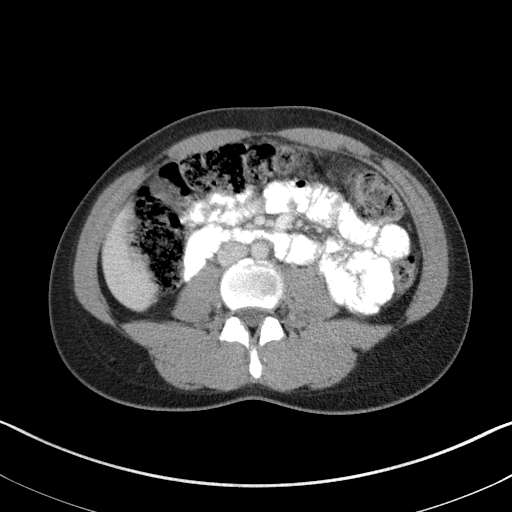
[im 51/91  soft-tissue]
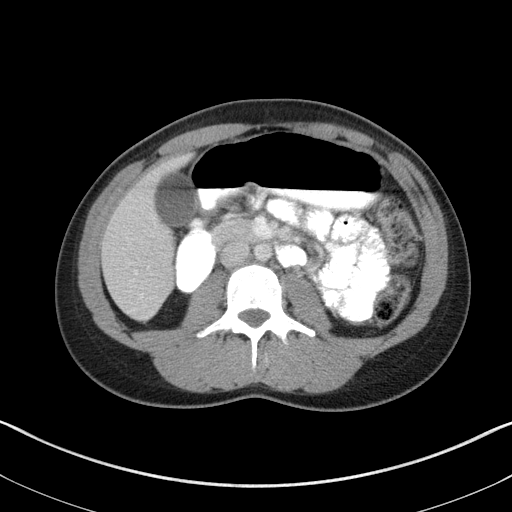
[im 58/91  soft-tissue]
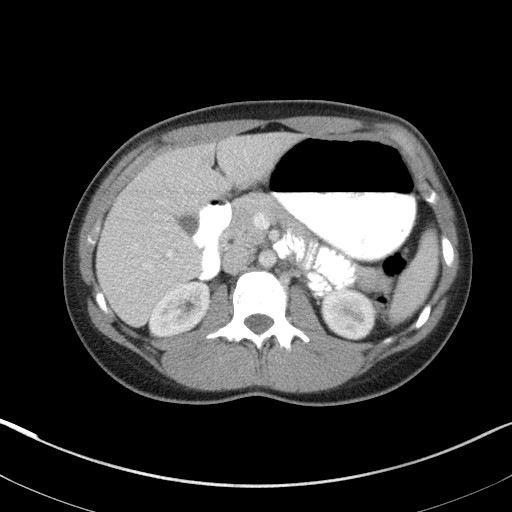
[im 58/91  bone]
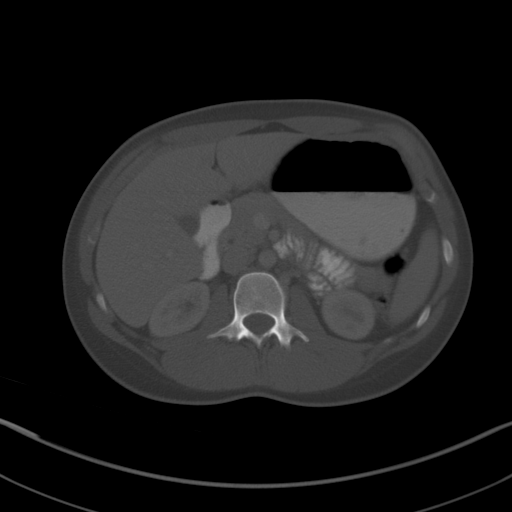
[im 65/91  soft-tissue]
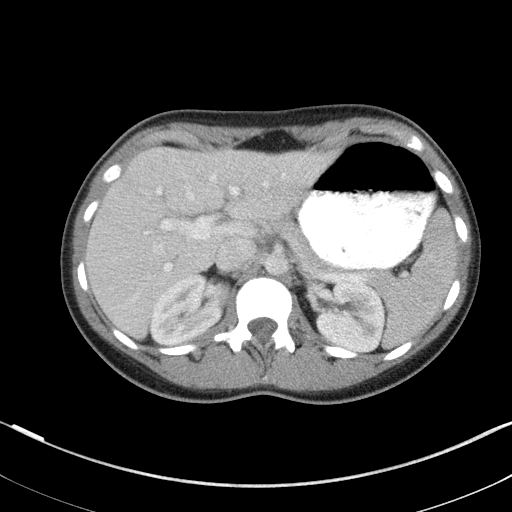
[im 73/91  soft-tissue]
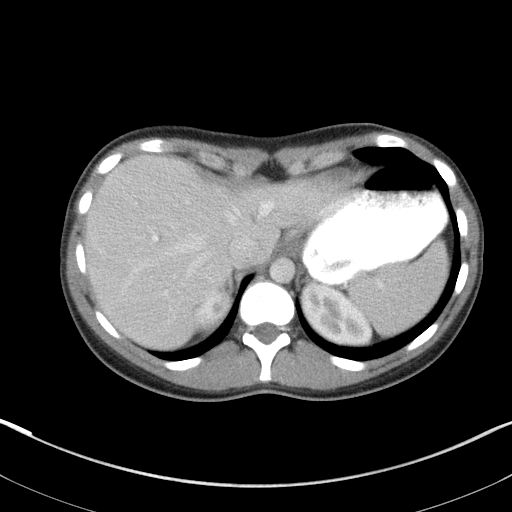
[im 80/91  soft-tissue]
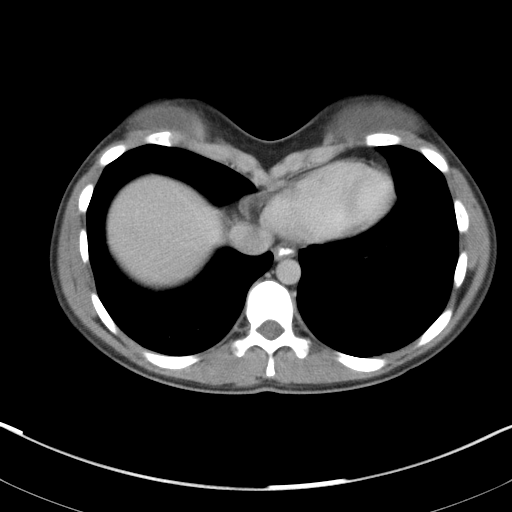
[im 87/91  soft-tissue]
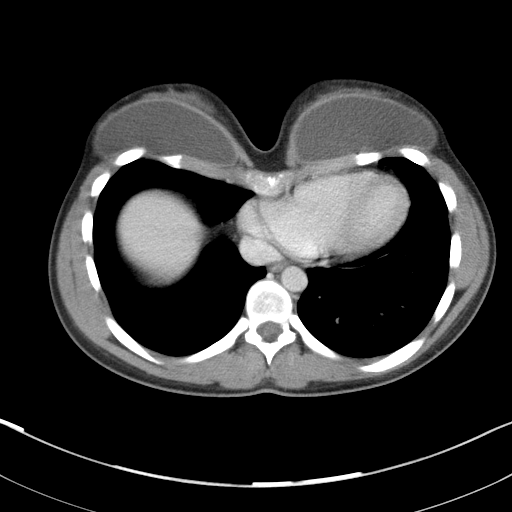

[Series 8: abd/pelvis 3.0 coronal · coronal · 0.76mm/px · 3 of 75 slices shown]
[im 25/75  soft-tissue]
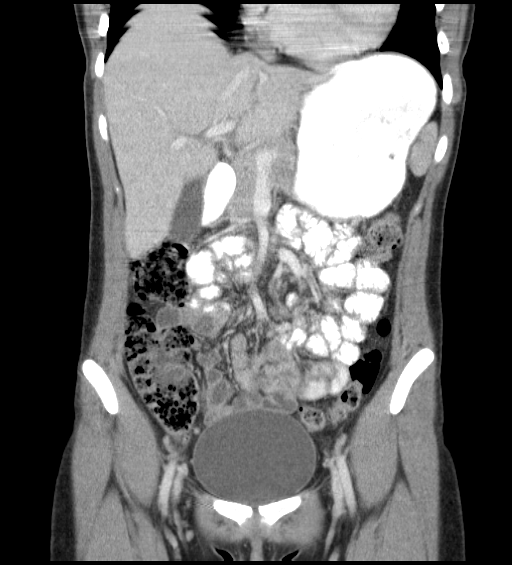
[im 33/75  soft-tissue]
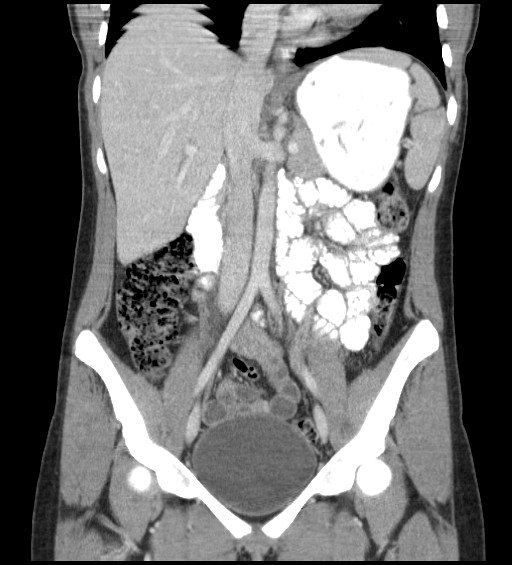
[im 42/75  soft-tissue]
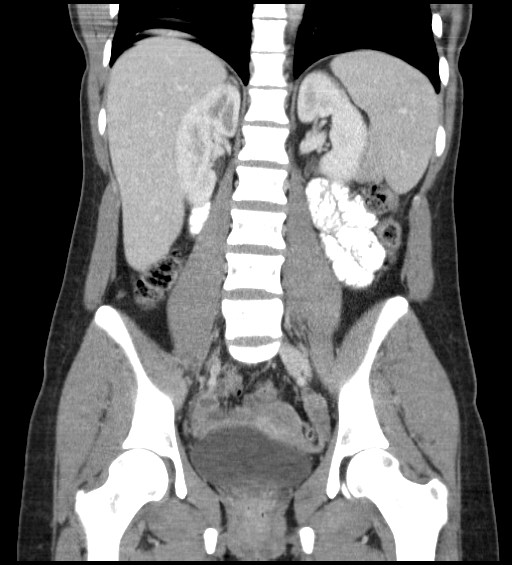

[16 of 46 positions shown; findings below may reference images not displayed]

FINDINGS: The visualized lung bases are clear.  Bilateral breast
implants appear intact.

The liver and spleen are unremarkable in appearance.  The
gallbladder is within normal limits.  The pancreas and adrenal
glands are unremarkable.

A small 1.2 cm focus of decreased attenuation is noted extending to
the periphery of the posterior right kidney.  Trace associated
perinephric fluid is noted, raising question for very mild focal
pyelonephritis.

The kidneys are otherwise unremarkable in appearance.  No
significant perinephric stranding is seen.  There is no evidence of
hydronephrosis.  No renal or ureteral stones are identified.

No free fluid is identified.  The small bowel is unremarkable in
appearance.  The stomach is filled with contrast and is within
normal limits.  No acute vascular abnormalities are seen.

The appendix normal in caliber and contains air, without evidence
for appendicitis.  The colon is largely filled with stool and is
unremarkable in appearance.

The bladder is moderately distended and grossly unremarkable in
appearance.  The uterus remains normal in appearance.  The ovaries
appear grossly symmetric, without suspicious adnexal mass.  No
inguinal lymphadenopathy is seen.

No acute osseous abnormalities are identified.
IMPRESSION: 1.  Small focus of decreased attenuation extending to the periphery
of the posterior right kidney, with trace associated perinephric
fluid, raising question for very mild focal pyelonephritis.
2.  Otherwise unremarkable CT of the abdomen and pelvis.

## 2013-12-12 ENCOUNTER — Encounter (HOSPITAL_COMMUNITY): Payer: Self-pay | Admitting: *Deleted

## 2014-02-10 NOTE — L&D Delivery Note (Signed)
Delivery Note Patient pushed for 10 minutes after she was noted to be C/C/+2 and at 4:30 PM a viable and healthy female was delivered via Vaginal, Spontaneous Delivery (Presentation: Middle Occiput Anterior).  APGAR: 8, 9; weight  .   Placenta status: Intact, Spontaneous.  Cord: 3 vessels with the following complications: None.   Anesthesia: Epidural  Episiotomy: None Lacerations: 1st degree;Perineal Suture Repair: 2.0 vicryl Est. Blood Loss (mL): 300  Mom to postpartum.  Baby to Couplet care / Skin to Skin.  Essie HartINN, Fannye Myer [name] 03/20/2014, 5:27 PM

## 2014-02-14 LAB — OB RESULTS CONSOLE GBS: GBS: POSITIVE

## 2014-03-17 ENCOUNTER — Telehealth (HOSPITAL_COMMUNITY): Payer: Self-pay | Admitting: *Deleted

## 2014-03-17 ENCOUNTER — Encounter (HOSPITAL_COMMUNITY): Payer: Self-pay | Admitting: *Deleted

## 2014-03-17 ENCOUNTER — Inpatient Hospital Stay (HOSPITAL_COMMUNITY)
Admission: AD | Admit: 2014-03-17 | Payer: PRIVATE HEALTH INSURANCE | Source: Ambulatory Visit | Admitting: Obstetrics and Gynecology

## 2014-03-17 NOTE — Telephone Encounter (Signed)
Preadmission screen  

## 2014-03-20 ENCOUNTER — Inpatient Hospital Stay (HOSPITAL_COMMUNITY): Payer: Medicaid Other | Admitting: Anesthesiology

## 2014-03-20 ENCOUNTER — Inpatient Hospital Stay (HOSPITAL_COMMUNITY)
Admission: RE | Admit: 2014-03-20 | Discharge: 2014-03-22 | DRG: 775 | Disposition: A | Discharge status: Home / Self Care | Payer: Medicaid Other | Source: Ambulatory Visit | Attending: Obstetrics & Gynecology | Admitting: Obstetrics & Gynecology

## 2014-03-20 ENCOUNTER — Encounter (HOSPITAL_COMMUNITY): Payer: Self-pay

## 2014-03-20 VITALS — BP 115/59 | HR 77 | Temp 97.8°F | Resp 17 | Ht 65.0 in | Wt 200.0 lb

## 2014-03-20 DIAGNOSIS — Z8249 Family history of ischemic heart disease and other diseases of the circulatory system: Secondary | ICD-10-CM | POA: Diagnosis not present

## 2014-03-20 DIAGNOSIS — K219 Gastro-esophageal reflux disease without esophagitis: Secondary | ICD-10-CM | POA: Diagnosis present

## 2014-03-20 DIAGNOSIS — Z3A40 40 weeks gestation of pregnancy: Secondary | ICD-10-CM | POA: Diagnosis present

## 2014-03-20 DIAGNOSIS — O99824 Streptococcus B carrier state complicating childbirth: Secondary | ICD-10-CM | POA: Diagnosis present

## 2014-03-20 DIAGNOSIS — Z3490 Encounter for supervision of normal pregnancy, unspecified, unspecified trimester: Secondary | ICD-10-CM

## 2014-03-20 DIAGNOSIS — O9962 Diseases of the digestive system complicating childbirth: Secondary | ICD-10-CM | POA: Diagnosis present

## 2014-03-20 DIAGNOSIS — IMO0001 Reserved for inherently not codable concepts without codable children: Secondary | ICD-10-CM

## 2014-03-20 DIAGNOSIS — O480 Post-term pregnancy: Secondary | ICD-10-CM | POA: Diagnosis present

## 2014-03-20 LAB — TYPE AND SCREEN
ABO/RH(D): O POS
Antibody Screen: NEGATIVE

## 2014-03-20 LAB — CBC
HEMATOCRIT: 37.7 % (ref 36.0–46.0)
Hemoglobin: 12.9 g/dL (ref 12.0–15.0)
MCH: 33 pg (ref 26.0–34.0)
MCHC: 34.2 g/dL (ref 30.0–36.0)
MCV: 96.4 fL (ref 78.0–100.0)
Platelets: 164 10*3/uL (ref 150–400)
RBC: 3.91 MIL/uL (ref 3.87–5.11)
RDW: 13.3 % (ref 11.5–15.5)
WBC: 11.3 10*3/uL — AB (ref 4.0–10.5)

## 2014-03-20 MED ORDER — OXYTOCIN 40 UNITS IN LACTATED RINGERS INFUSION - SIMPLE MED: 62.5000 mL/h | INTRAVENOUS | Status: DC | PRN

## 2014-03-20 MED ORDER — IBUPROFEN 600 MG PO TABS
600.0000 mg | ORAL_TABLET | Freq: Four times a day (QID) | ORAL | Status: DC
  Administered 2014-03-20 – 2014-03-22 (×6): 600 mg via ORAL
  Filled 2014-03-20 (×6): qty 1

## 2014-03-20 MED ORDER — OXYCODONE-ACETAMINOPHEN 5-325 MG PO TABS: 1.0000 | ORAL_TABLET | ORAL | Status: DC | PRN

## 2014-03-20 MED ORDER — EPHEDRINE 5 MG/ML INJ
10.0000 mg | INTRAVENOUS | Status: DC | PRN
  Filled 2014-03-20: qty 2

## 2014-03-20 MED ORDER — DIPHENHYDRAMINE HCL 50 MG/ML IJ SOLN: 12.5000 mg | INTRAMUSCULAR | Status: DC | PRN

## 2014-03-20 MED ORDER — VANCOMYCIN HCL IN DEXTROSE 1-5 GM/200ML-% IV SOLN
1000.0000 mg | Freq: Two times a day (BID) | INTRAVENOUS | Status: DC
  Administered 2014-03-20: 1000 mg via INTRAVENOUS
  Filled 2014-03-20 (×2): qty 200

## 2014-03-20 MED ORDER — OXYTOCIN 40 UNITS IN LACTATED RINGERS INFUSION - SIMPLE MED
62.5000 mL/h | INTRAVENOUS | Status: DC
  Administered 2014-03-20: 62.5 mL/h via INTRAVENOUS

## 2014-03-20 MED ORDER — DIBUCAINE 1 % RE OINT
1.0000 | TOPICAL_OINTMENT | RECTAL | Status: DC | PRN
Start: 2014-03-20 — End: 2014-03-22

## 2014-03-20 MED ORDER — TERBUTALINE SULFATE 1 MG/ML IJ SOLN: 0.2500 mg | Freq: Once | INTRAMUSCULAR | Status: DC | PRN

## 2014-03-20 MED ORDER — DIPHENHYDRAMINE HCL 25 MG PO CAPS: 25.0000 mg | ORAL_CAPSULE | Freq: Four times a day (QID) | ORAL | Status: DC | PRN

## 2014-03-20 MED ORDER — METHYLERGONOVINE MALEATE 0.2 MG/ML IJ SOLN
INTRAMUSCULAR | Status: AC
  Administered 2014-03-20: 0.2 mg
  Filled 2014-03-20: qty 1

## 2014-03-20 MED ORDER — LACTATED RINGERS IV SOLN: 500.0000 mL | INTRAVENOUS | Status: DC | PRN

## 2014-03-20 MED ORDER — OXYTOCIN BOLUS FROM INFUSION: 500.0000 mL | INTRAVENOUS | Status: DC

## 2014-03-20 MED ORDER — WITCH HAZEL-GLYCERIN EX PADS: 1.0000 "application " | MEDICATED_PAD | CUTANEOUS | Status: DC | PRN

## 2014-03-20 MED ORDER — PHENYLEPHRINE 40 MCG/ML (10ML) SYRINGE FOR IV PUSH (FOR BLOOD PRESSURE SUPPORT)
80.0000 ug | PREFILLED_SYRINGE | INTRAVENOUS | Status: DC | PRN
  Filled 2014-03-20: qty 2

## 2014-03-20 MED ORDER — LACTATED RINGERS IV SOLN: INTRAVENOUS | Status: DC

## 2014-03-20 MED ORDER — FLEET ENEMA 7-19 GM/118ML RE ENEM: 1.0000 | ENEMA | RECTAL | Status: DC | PRN

## 2014-03-20 MED ORDER — ONDANSETRON HCL 4 MG PO TABS: 4.0000 mg | ORAL_TABLET | ORAL | Status: DC | PRN

## 2014-03-20 MED ORDER — ACETAMINOPHEN 325 MG PO TABS: 650.0000 mg | ORAL_TABLET | ORAL | Status: DC | PRN

## 2014-03-20 MED ORDER — ZOLPIDEM TARTRATE 5 MG PO TABS: 5.0000 mg | ORAL_TABLET | Freq: Every evening | ORAL | Status: DC | PRN

## 2014-03-20 MED ORDER — FENTANYL 2.5 MCG/ML BUPIVACAINE 1/10 % EPIDURAL INFUSION (WH - ANES)
INTRAMUSCULAR | Status: DC | PRN
  Administered 2014-03-20: 14 mL/h via EPIDURAL

## 2014-03-20 MED ORDER — FENTANYL 2.5 MCG/ML BUPIVACAINE 1/10 % EPIDURAL INFUSION (WH - ANES)
14.0000 mL/h | INTRAMUSCULAR | Status: DC | PRN
  Administered 2014-03-20: 14 mL/h via EPIDURAL
  Filled 2014-03-20: qty 125

## 2014-03-20 MED ORDER — ONDANSETRON HCL 4 MG/2ML IJ SOLN: 4.0000 mg | Freq: Four times a day (QID) | INTRAMUSCULAR | Status: DC | PRN

## 2014-03-20 MED ORDER — BENZOCAINE-MENTHOL 20-0.5 % EX AERO
1.0000 | INHALATION_SPRAY | CUTANEOUS | Status: DC | PRN
Start: 2014-03-20 — End: 2014-03-22
  Administered 2014-03-21: 1 via TOPICAL
  Filled 2014-03-20: qty 56

## 2014-03-20 MED ORDER — OXYCODONE-ACETAMINOPHEN 5-325 MG PO TABS: 2.0000 | ORAL_TABLET | ORAL | Status: DC | PRN

## 2014-03-20 MED ORDER — LANOLIN HYDROUS EX OINT: TOPICAL_OINTMENT | CUTANEOUS | Status: DC | PRN

## 2014-03-20 MED ORDER — METHYLERGONOVINE MALEATE 0.2 MG/ML IJ SOLN: 0.2000 mg | INTRAMUSCULAR | Status: DC | PRN

## 2014-03-20 MED ORDER — SIMETHICONE 80 MG PO CHEW
80.0000 mg | CHEWABLE_TABLET | ORAL | Status: DC | PRN
Start: 2014-03-20 — End: 2014-03-22

## 2014-03-20 MED ORDER — METHYLERGONOVINE MALEATE 0.2 MG PO TABS: 0.2000 mg | ORAL_TABLET | ORAL | Status: DC | PRN

## 2014-03-20 MED ORDER — TETANUS-DIPHTH-ACELL PERTUSSIS 5-2.5-18.5 LF-MCG/0.5 IM SUSP: 0.5000 mL | Freq: Once | INTRAMUSCULAR | Status: DC

## 2014-03-20 MED ORDER — LACTATED RINGERS IV SOLN
500.0000 mL | Freq: Once | INTRAVENOUS | Status: AC
  Administered 2014-03-20: 500 mL via INTRAVENOUS

## 2014-03-20 MED ORDER — SENNOSIDES-DOCUSATE SODIUM 8.6-50 MG PO TABS
2.0000 | ORAL_TABLET | ORAL | Status: DC
  Administered 2014-03-20 – 2014-03-21 (×2): 2 via ORAL
  Filled 2014-03-20 (×2): qty 2

## 2014-03-20 MED ORDER — MEASLES, MUMPS & RUBELLA VAC ~~LOC~~ INJ
0.5000 mL | INJECTION | Freq: Once | SUBCUTANEOUS | Status: AC
  Administered 2014-03-21: 0.5 mL via SUBCUTANEOUS
  Filled 2014-03-20: qty 0.5

## 2014-03-20 MED ORDER — PHENYLEPHRINE 40 MCG/ML (10ML) SYRINGE FOR IV PUSH (FOR BLOOD PRESSURE SUPPORT)
80.0000 ug | PREFILLED_SYRINGE | INTRAVENOUS | Status: DC | PRN
  Filled 2014-03-20: qty 20
  Filled 2014-03-20: qty 2

## 2014-03-20 MED ORDER — LIDOCAINE HCL (PF) 1 % IJ SOLN
INTRAMUSCULAR | Status: DC | PRN
  Administered 2014-03-20 (×2): 4 mL

## 2014-03-20 MED ORDER — MISOPROSTOL 200 MCG PO TABS
ORAL_TABLET | ORAL | Status: AC
  Filled 2014-03-20: qty 5

## 2014-03-20 MED ORDER — OXYTOCIN 40 UNITS IN LACTATED RINGERS INFUSION - SIMPLE MED
1.0000 m[IU]/min | INTRAVENOUS | Status: DC
  Administered 2014-03-20: 4 m[IU]/min via INTRAVENOUS
  Administered 2014-03-20: 8 m[IU]/min via INTRAVENOUS
  Administered 2014-03-20: 2 m[IU]/min via INTRAVENOUS
  Administered 2014-03-20: 6 m[IU]/min via INTRAVENOUS
  Filled 2014-03-20: qty 1000

## 2014-03-20 MED ORDER — ONDANSETRON HCL 4 MG/2ML IJ SOLN
4.0000 mg | INTRAMUSCULAR | Status: DC | PRN
Start: 2014-03-20 — End: 2014-03-22

## 2014-03-20 MED ORDER — PRENATAL MULTIVITAMIN CH
1.0000 | ORAL_TABLET | Freq: Every day | ORAL | Status: DC
  Administered 2014-03-21: 1 via ORAL
  Filled 2014-03-20: qty 1

## 2014-03-20 MED ORDER — CITRIC ACID-SODIUM CITRATE 334-500 MG/5ML PO SOLN
30.0000 mL | ORAL | Status: DC | PRN
  Administered 2014-03-20: 30 mL via ORAL
  Filled 2014-03-20 (×2): qty 15

## 2014-03-20 MED ORDER — LIDOCAINE HCL (PF) 1 % IJ SOLN
30.0000 mL | INTRAMUSCULAR | Status: DC | PRN
  Filled 2014-03-20: qty 30

## 2014-03-20 NOTE — Anesthesia Procedure Notes (Addendum)
Epidural Patient location during procedure: OB Start time: 03/20/2014 1:59 PM  Staffing Anesthesiologist: Nasirah Sachs A. Performed by: anesthesiologist   Preanesthetic Checklist Completed: patient identified, site marked, surgical consent, pre-op evaluation, timeout performed, IV checked, risks and benefits discussed and monitors and equipment checked  Epidural Patient position: sitting Prep: site prepped and draped and DuraPrep Patient monitoring: continuous pulse ox and blood pressure Approach: midline Location: L3-L4 Injection technique: LOR air  Needle:  Needle type: Tuohy  Needle gauge: 17 G Needle length: 9 cm and 9 Needle insertion depth: 6 cm Catheter type: closed end flexible Catheter size: 19 Gauge Catheter at skin depth: 11 cm Test dose: negative and Other  Assessment Events: blood not aspirated, injection not painful, no injection resistance, negative IV test and no paresthesia  Additional Notes Patient identified. Risks and benefits discussed including failed block, incomplete  Pain control, post dural puncture headache, nerve damage, paralysis, blood pressure Changes, nausea, vomiting, reactions to medications-both toxic and allergic and post Partum back pain. All questions were answered. Patient expressed understanding and wished to proceed. Sterile technique was used throughout procedure. Epidural site was Dressed with sterile barrier dressing. No paresthesias, signs of intravascular injection Or signs of intrathecal spread were encountered.  Patient was more comfortable after the epidural was dosed. Please see RN's note for documentation of vital signs and FHR which are stable.

## 2014-03-20 NOTE — Progress Notes (Signed)
Donna [name] is a 24 y.o. G2P1001 at 3226w3d by LMP admitted for induction of labor due to Post dates. Due date 03/17/14.  Subjective:  Patient comfortable doesn't feel pressure  Objective: BP 146/74 mmHg  Pulse 101  Temp(Src) 98.3 F (36.8 C) (Oral)  Resp 18  Ht 5\' 5"  (1.651 m)  Wt 90.719 kg (200 lb)  BMI 33.28 kg/m2      FHT:  FHR: 140 bpm, variability: moderate,  accelerations:  Present,  decelerations:  Absent UC:   regular, every 3 minutes SVE:   Dilation: 10 Effacement (%): 100 Station: 0 Exam by:: Dr Mora ApplPinn  Labs: Lab Results  Component Value Date   WBC 11.3* 03/20/2014   HGB 12.9 03/20/2014   HCT 37.7 03/20/2014   MCV 96.4 03/20/2014   PLT 164 03/20/2014    Assessment / Plan: Induction of labor due to post dates,  progressing well on pitocin, now in second stage  Labor: Progressing normally and unsure if SROM, no bag felt Preeclampsia:  no signs or symptoms of toxicity Fetal Wellbeing:  Category I Pain Control:  Epidural I/D:  n/a Anticipated MOD:  NSVD  Donna [name] Donna [name] 03/20/2014, 3:51 PM

## 2014-03-20 NOTE — Progress Notes (Signed)
Delivery of live viable female by Dr Mora ApplPinn. APGARS 8,9

## 2014-03-20 NOTE — H&P (Signed)
Donna [name] is a 24 y.o. female presenting for social induction for post dates. She is having irregular ctx, no leaking of fluid, no vaginal bleeding, notes good fetal movement  Maternal Medical History:  Reason for admission: Nausea. Social induction for post dates   Contractions: Onset was 3-5 hours ago.   Frequency: irregular.   Duration is approximately 60 seconds.   Perceived severity is mild.    Fetal activity: Perceived fetal activity is normal.   Last perceived fetal movement was within the past 12 hours.    Prenatal complications: no prenatal complications Prenatal Complications - Diabetes: none.    OB History    Gravida Para Term Preterm AB TAB SAB Ectopic Multiple Living   2 1 1  0 0 0 0 0 0 1     Past Medical History  Diagnosis Date  . Genital HSV    Past Surgical History  Procedure Laterality Date  . Breast surgery      Augmentation 2010  . Wisdom tooth extraction     Family History: family history includes Hypertension in her maternal grandfather and mother. Social History:  reports that she has never smoked. She does not have any smokeless tobacco history on file. She reports that she does not drink alcohol or use illicit drugs.   Prenatal Transfer Tool  Maternal Diabetes: No Genetic Screening: Normal Maternal Ultrasounds/Referrals: Normal Fetal Ultrasounds or other Referrals:  Other:  anatomy scan normal Maternal Substance Abuse:  No Significant Maternal Medications:  Meds include: Other:  Valtrex suppressive therapy Significant Maternal Lab Results:  Lab values include: Group B Strep positive, Other: Rubella non-immune Other Comments:  None  Review of Systems  Constitutional: Negative.  Negative for fever and chills.  HENT: Negative for hearing loss.   Eyes: Negative.  Negative for blurred vision and double vision.  Respiratory: Negative for cough and hemoptysis.   Cardiovascular: Negative for chest pain and palpitations.  Gastrointestinal:  Negative.  Negative for heartburn and nausea.  Genitourinary: Negative for dysuria and urgency.  Musculoskeletal: Negative for myalgias and neck pain.  Skin: Negative for itching and rash.  Neurological: Negative for dizziness, tingling and headaches.  Endo/Heme/Allergies: Negative.  Negative for environmental allergies. Does not bruise/bleed easily.  Psychiatric/Behavioral: Negative for depression and suicidal ideas.  All other systems reviewed and are negative.   Dilation: 10 Effacement (%): 100 Station: 0 Exam by:: Dr Mora ApplPinn Blood pressure 146/74, pulse 101, temperature 98.3 F (36.8 C), temperature source Oral, resp. rate 18, height 5\' 5"  (1.651 m), weight 90.719 kg (200 lb), unknown if currently breastfeeding. Maternal Exam:  Uterine Assessment: Contraction strength is mild.  Contraction duration is 60 seconds. Contraction frequency is regular.   Abdomen: Patient reports no abdominal tenderness. Fundal height is 40 cm.   Estimated fetal weight is 3100 grams.   Fetal presentation: vertex  Introitus: Normal vulva. Normal vagina.  Pelvis: adequate for delivery.   Cervix: Cervix evaluated by digital exam.   2.5 /50/-2  Fetal Exam Fetal Monitor Review: Mode: hand-held doppler probe.   Baseline rate: 140.  Variability: moderate (6-25 bpm).   Pattern: accelerations present and no decelerations.    Fetal State Assessment: Category I - tracings are normal.     Physical Exam  Vitals reviewed. Constitutional: She is oriented to person, place, and time. She appears well-developed and well-nourished.  HENT:  Head: Normocephalic and atraumatic.  Eyes: Pupils are equal, round, and reactive to light.  Neck: Normal range of motion.  Cardiovascular: Normal rate, regular  rhythm and normal heart sounds.   Respiratory: Effort normal and breath sounds normal.  GI: Soft. Bowel sounds are normal.  Genitourinary: Vagina normal.  Musculoskeletal: Normal range of motion.  Neurological: She  is alert and oriented to person, place, and time.  Skin: Skin is warm and dry.    Prenatal labs: ABO, Rh: --/--/O POS (02/08 0750) Antibody: NEG (02/08 0750) Rubella: Nonimmune (07/29 0000) RPR: Nonreactive (07/29 0000)  HBsAg: Negative (07/29 0000)  HIV: Non-reactive, Non-reactive, Non-reactive (07/29 0000)  GBS: Positive (01/05 0000)   Assessment/Plan: 24 yo G2P1 at 40 weeks 3 days admitted for social induction for post dates Continuous EFM / TOCO Epidural on demand  Pitocin for induction Moderate to High PPH risk (h/o PPH first pregnancy)   Donna [name] 03/20/2014, 3:46 PM

## 2014-03-20 NOTE — Lactation Note (Addendum)
This note was copied from the chart of Donna [name]. Lactation Consultation Note  Patient Name: Donna [name] HYQMV'HToday's Date: 03/20/2014 Reason for consult: Initial assessment Baby 5 hours of life. Called to assist mom by CN nurse. Mom has history of difficult latch with first baby, low supply, and some sort of breast infection per mom. Mom states that she tried to nurse for 3 weeks. Assisted mom to hand express, but no colostrum noted from either breast. Mom states she was able to latch baby for a few seconds on left breast, but then baby pulls away. Assisted mom to latch baby to left breast in football position. LC can latch baby, but baby slips off repeatedly. Latched baby to right with same result. Baby very fussy. Appears baby may have a tight frenulum, but did not mention to mom. Fitted mom with a #20 NS and baby latched well, suckling rhythmically with lips flanged, though no swallows noted. Baby still fussy at breast, but able to maintain a latch. Demonstrated to mom how to tug lower chin to flange lip outward. No colostrum noted in NS. Given mom difficulty with latching and low supply with first baby, enc mom to consider starting with a DEBP in the morning if she is still not seeing colostrum and baby still fussy at breast. Mom states she has Medicaid and active with WIC. [facility]. Mom given a hand pump with instructions for extra stimulation through the night and to assist with pulling nipple out.  Enc mom to offer lots of STS, nurse with cues, and ask for assistance with latching as needed. [facility], aware of OP/BFSG, and LC phone line assistance after D/C. Discussed assessment, interventions, and plan with patient's RN, Victorino DikeJennifer.   Maternal Data Has patient been taught Hand Expression?: Yes Does the patient have breastfeeding experience prior to this delivery?: Yes  Feeding Feeding Type: Breast Fed Length of feed:  (LC assessed first 25  minutes of bf.)  LATCH Score/Interventions Latch: Repeated attempts needed to sustain latch, nipple held in mouth throughout feeding, stimulation needed to elicit sucking reflex.  Audible Swallowing: None Intervention(s): Skin to skin;Hand expression  Type of Nipple: Flat (short shafts. ) Intervention(s): Hand pump  Comfort (Breast/Nipple): Soft / non-tender     Hold (Positioning): Assistance needed to correctly position infant at breast and maintain latch. Intervention(s): Breastfeeding basics reviewed;Support Pillows;Position options;Skin to skin  LATCH Score: 5  Lactation Tools Discussed/Used Tools: Pump;Nipple Shields Nipple shield size: 20 Breast pump type: Manual   Consult Status Consult Status: Follow-up Date: 03/21/14 Follow-up type: In-patient    Geralynn OchsWILLIARD, Dmitriy Gair 03/20/2014, 10:18 PM

## 2014-03-20 NOTE — Anesthesia Preprocedure Evaluation (Signed)
Anesthesia Evaluation  Patient identified by MRN, date of birth, ID band Patient awake    Reviewed: Allergy & Precautions, Patient's Chart, lab work & pertinent test results  Airway Mallampati: III  TM Distance: >3 FB Neck ROM: Full    Dental no notable dental hx. (+) Teeth Intact   Pulmonary neg pulmonary ROS,  breath sounds clear to auscultation  Pulmonary exam normal       Cardiovascular negative cardio ROS  Rhythm:Regular Rate:Normal     Neuro/Psych negative neurological ROS  negative psych ROS   GI/Hepatic Neg liver ROS, GERD-  Medicated and Controlled,  Endo/Other  Obesity  Renal/GU negative Renal ROS  negative genitourinary   Musculoskeletal negative musculoskeletal ROS (+)   Abdominal (+) + obese,   Peds  Hematology   Anesthesia Other Findings   Reproductive/Obstetrics (+) Pregnancy HSV                             Anesthesia Physical Anesthesia Plan  ASA: II  Anesthesia Plan: Epidural   Post-op Pain Management:    Induction:   Airway Management Planned: Natural Airway  Additional Equipment:   Intra-op Plan:   Post-operative Plan:   Informed Consent: I have reviewed the patients History and Physical, chart, labs and discussed the procedure including the risks, benefits and alternatives for the proposed anesthesia with the patient or authorized representative who has indicated his/her understanding and acceptance.   Dental advisory given  Plan Discussed with: Anesthesiologist  Anesthesia Plan Comments:         Anesthesia Quick Evaluation

## 2014-03-21 LAB — CBC
HEMATOCRIT: 33.4 % — AB (ref 36.0–46.0)
HEMOGLOBIN: 11.4 g/dL — AB (ref 12.0–15.0)
MCH: 33.4 pg (ref 26.0–34.0)
MCHC: 34.1 g/dL (ref 30.0–36.0)
MCV: 97.9 fL (ref 78.0–100.0)
Platelets: 147 10*3/uL — ABNORMAL LOW (ref 150–400)
RBC: 3.41 MIL/uL — ABNORMAL LOW (ref 3.87–5.11)
RDW: 13.5 % (ref 11.5–15.5)
WBC: 12.6 10*3/uL — AB (ref 4.0–10.5)

## 2014-03-21 LAB — RPR: RPR Ser Ql: NONREACTIVE

## 2014-03-21 NOTE — Progress Notes (Signed)
UR chart review completed.  

## 2014-03-21 NOTE — Progress Notes (Signed)
Patient is eating, ambulating, voiding.  Pain control is good.  Appropriate lochia, no complaints.  Filed Vitals:   03/20/14 2045 03/21/14 0100 03/21/14 0712 03/21/14 0835  BP: 120/60 128/86 121/71 128/71  Pulse: 111 103 78 88  Temp: 97.7 F (36.5 C) 98 F (36.7 C)  98.4 F (36.9 C)  TempSrc:  Oral  Oral  Resp: 18 18 17 20   Height:      Weight:        Fundus firm Perineum without swelling. No CT  Lab Results  Component Value Date   WBC 12.6* 03/21/2014   HGB 11.4* 03/21/2014   HCT 33.4* 03/21/2014   MCV 97.9 03/21/2014   PLT 147* 03/21/2014    --/--/O POS (02/08 0750)  A/P Post partum day 1.  Routine care.  Expect d/c 2/10.    Philip AspenALLAHAN, Laurel Harnden

## 2014-03-21 NOTE — Anesthesia Postprocedure Evaluation (Signed)
  Anesthesia Post-op Note  Patient: Donna [name]  Procedure(s) Performed: * No procedures listed *  Patient Location: Mother/Baby  Anesthesia Type:Epidural  Level of Consciousness: awake, alert , oriented and patient cooperative  Airway and Oxygen Therapy: Patient Spontanous Breathing  Post-op Pain: none  Post-op Assessment: Post-op Vital signs reviewed, Patient's Cardiovascular Status Stable, Respiratory Function Stable, Patent Airway, No headache, No backache, No residual numbness and No residual motor weakness  Post-op Vital Signs: Reviewed and stable  Last Vitals:  Filed Vitals:   03/21/14 0712  BP: 121/71  Pulse: 78  Temp:   Resp: 17    Complications: No apparent anesthesia complications

## 2014-03-22 MED ORDER — DOCUSATE SODIUM 100 MG PO CAPS: 100.0000 mg | ORAL_CAPSULE | Freq: Two times a day (BID) | ORAL | Status: DC

## 2014-03-22 MED ORDER — IBUPROFEN 600 MG PO TABS: 600.0000 mg | ORAL_TABLET | Freq: Four times a day (QID) | ORAL | Status: DC | PRN

## 2014-03-22 MED ORDER — OXYCODONE-ACETAMINOPHEN 5-325 MG PO TABS: 2.0000 | ORAL_TABLET | ORAL | Status: DC | PRN

## 2014-03-22 NOTE — Discharge Summary (Signed)
Obstetric Discharge Summary Reason for Admission: induction of labor Prenatal Procedures: ultrasound Intrapartum Procedures: spontaneous vaginal delivery Postpartum Procedures: none Complications-Operative and Postpartum: 1st degree perineal laceration HEMOGLOBIN  Date Value Ref Range Status  03/21/2014 11.4* 12.0 - 15.0 g/dL Final  29/56/213005/17/2013 86.513.6 12.2 - 16.2 g/dL Final   HCT  Date Value Ref Range Status  03/21/2014 33.4* 36.0 - 46.0 % Final   HCT, POC  Date Value Ref Range Status  06/27/2011 41.5 37.7 - 47.9 % Final    Physical Exam:  General: alert, cooperative and appears stated age 19Lochia: appropriate Uterine Fundus: firm Incision: healing well DVT Evaluation: No evidence of DVT seen on physical exam.  Discharge Diagnoses: Term Pregnancy-delivered  Discharge Information: Date: 03/22/2014 Activity: pelvic rest Diet: routine Medications: Ibuprofen, Colace and Percocet Condition: improved Instructions: refer to practice specific booklet Discharge to: home Follow-up Information    Follow up with PINN, Sanjuana MaeWALDA [name], MD In 4 weeks.   Specialty:  Obstetrics and Gynecology   Why:  For a postpartum visit   Contact information:   [address] [place] [address] [phone]       Newborn Data: Live born female  Birth Weight: 7 lb 13 oz (3544 g) APGAR: 8, 9  Home with mother.  Eevie Lapp H. 03/22/2014, 8:53 AM

## 2014-07-01 ENCOUNTER — Emergency Department (HOSPITAL_BASED_OUTPATIENT_CLINIC_OR_DEPARTMENT_OTHER)
Admission: EM | Admit: 2014-07-01 | Discharge: 2014-07-01 | Disposition: A | Discharge status: Home / Self Care | Payer: BLUE CROSS/BLUE SHIELD | Attending: Emergency Medicine | Admitting: Emergency Medicine

## 2014-07-01 ENCOUNTER — Encounter (HOSPITAL_BASED_OUTPATIENT_CLINIC_OR_DEPARTMENT_OTHER): Payer: Self-pay

## 2014-07-01 DIAGNOSIS — Z880 Allergy status to penicillin: Secondary | ICD-10-CM | POA: Insufficient documentation

## 2014-07-01 DIAGNOSIS — J0100 Acute maxillary sinusitis, unspecified: Secondary | ICD-10-CM | POA: Diagnosis not present

## 2014-07-01 DIAGNOSIS — Z79899 Other long term (current) drug therapy: Secondary | ICD-10-CM | POA: Diagnosis not present

## 2014-07-01 DIAGNOSIS — Z8619 Personal history of other infectious and parasitic diseases: Secondary | ICD-10-CM | POA: Insufficient documentation

## 2014-07-01 DIAGNOSIS — K047 Periapical abscess without sinus: Secondary | ICD-10-CM | POA: Diagnosis not present

## 2014-07-01 DIAGNOSIS — R0981 Nasal congestion: Secondary | ICD-10-CM | POA: Diagnosis present

## 2014-07-01 MED ORDER — CLINDAMYCIN HCL 300 MG PO CAPS: 300.0000 mg | ORAL_CAPSULE | Freq: Four times a day (QID) | ORAL | Status: DC

## 2014-07-01 MED ORDER — CLINDAMYCIN HCL 150 MG PO CAPS
300.0000 mg | ORAL_CAPSULE | Freq: Once | ORAL | Status: AC
  Administered 2014-07-01: 300 mg via ORAL
  Filled 2014-07-01: qty 2

## 2014-07-01 MED ORDER — GUAIFENESIN ER 600 MG PO TB12: 600.0000 mg | ORAL_TABLET | Freq: Two times a day (BID) | ORAL | Status: DC

## 2014-07-01 MED ORDER — IBUPROFEN 800 MG PO TABS: 800.0000 mg | ORAL_TABLET | Freq: Three times a day (TID) | ORAL | Status: DC

## 2014-07-01 MED ORDER — IBUPROFEN 800 MG PO TABS
800.0000 mg | ORAL_TABLET | Freq: Once | ORAL | Status: DC
  Filled 2014-07-01: qty 1

## 2014-07-01 NOTE — ED Notes (Signed)
Pt reports 1 week of sinus congestion, pressure and now with dental pain in right upper wisdom tooth.  No swelling.

## 2014-07-01 NOTE — Discharge Instructions (Signed)
Dental Abscess °A dental abscess is a collection of infected fluid (pus) from a bacterial infection in the inner part of the tooth (pulp). It usually occurs at the end of the tooth's root.  °CAUSES  °· Severe tooth decay. °· Trauma to the tooth that allows bacteria to enter into the pulp, such as a broken or chipped tooth. °SYMPTOMS  °· Severe pain in and around the infected tooth. °· Swelling and redness around the abscessed tooth or in the mouth or face. °· Tenderness. °· Pus drainage. °· Bad breath. °· Bitter taste in the mouth. °· Difficulty swallowing. °· Difficulty opening the mouth. °· Nausea. °· Vomiting. °· Chills. °· Swollen neck glands. °DIAGNOSIS  °· A medical and dental history will be taken. °· An examination will be performed by tapping on the abscessed tooth. °· X-rays may be taken of the tooth to identify the abscess. °TREATMENT °The goal of treatment is to eliminate the infection. You may be prescribed antibiotic medicine to stop the infection from spreading. A root canal may be performed to save the tooth. If the tooth cannot be saved, it may be pulled (extracted) and the abscess may be drained.  °HOME CARE INSTRUCTIONS °· Only take over-the-counter or prescription medicines for pain, fever, or discomfort as directed by your caregiver. °· Rinse your mouth (gargle) often with salt water (¼ tsp salt in 8 oz [250 ml] of warm water) to relieve pain or swelling. °· Do not drive after taking pain medicine (narcotics). °· Do not apply heat to the outside of your face. °· Return to your dentist for further treatment as directed. °SEEK MEDICAL CARE IF: °· Your pain is not helped by medicine. °· Your pain is getting worse instead of better. °SEEK IMMEDIATE MEDICAL CARE IF: °· You have a fever or persistent symptoms for more than 2-3 days. °· You have a fever and your symptoms suddenly get worse. °· You have chills or a very bad headache. °· You have problems breathing or swallowing. °· You have trouble  opening your mouth. °· You have swelling in the neck or around the eye. °Document Released: 01/27/2005 Document Revised: 10/22/2011 Document Reviewed: 05/07/2010 °ExitCare® Patient Information ©2015 [employer]. This information is not intended to replace advice given to you by your health care provider. Make sure you discuss any questions you have with your health care provider. °Sinusitis °Sinusitis is redness, soreness, and inflammation of the paranasal sinuses. Paranasal sinuses are air pockets within the bones of your face (beneath the eyes, the middle of the forehead, or above the eyes). In healthy paranasal sinuses, mucus is able to drain out, and air is able to circulate through them by way of your nose. However, when your paranasal sinuses are inflamed, mucus and air can become trapped. This can allow bacteria and other germs to grow and cause infection. °Sinusitis can develop quickly and last only a short time (acute) or continue over a long period (chronic). Sinusitis that lasts for more than 12 weeks is considered chronic.  °CAUSES  °Causes of sinusitis include: °· Allergies. °· Structural abnormalities, such as displacement of the cartilage that separates your nostrils (deviated septum), which can decrease the air flow through your nose and sinuses and affect sinus drainage. °· Functional abnormalities, such as when the small hairs (cilia) that line your sinuses and help remove mucus do not work properly or are not present. °SIGNS AND SYMPTOMS  °Symptoms of acute and chronic sinusitis are the same. The primary symptoms are   pain and pressure around the affected sinuses. Other symptoms include:  Upper toothache.  Earache.  Headache.  Bad breath.  Decreased sense of smell and taste.  A cough, which worsens when you are lying flat.  Fatigue.  Fever.  Thick drainage from your nose, which often is green and may contain pus (purulent).  Swelling and warmth over the affected  sinuses. DIAGNOSIS  Your health care provider will perform a physical exam. During the exam, your health care provider may:  Look in your nose for signs of abnormal growths in your nostrils (nasal polyps).  Tap over the affected sinus to check for signs of infection.  View the inside of your sinuses (endoscopy) using an imaging device that has a light attached (endoscope). If your health care provider suspects that you have chronic sinusitis, one or more of the following tests may be recommended:  Allergy tests.  Nasal culture. A sample of mucus is taken from your nose, sent to a lab, and screened for bacteria.  Nasal cytology. A sample of mucus is taken from your nose and examined by your health care provider to determine if your sinusitis is related to an allergy. TREATMENT  Most cases of acute sinusitis are related to a viral infection and will resolve on their own within 10 days. Sometimes medicines are prescribed to help relieve symptoms (pain medicine, decongestants, nasal steroid sprays, or saline sprays).  However, for sinusitis related to a bacterial infection, your health care provider will prescribe antibiotic medicines. These are medicines that will help kill the bacteria causing the infection.  Rarely, sinusitis is caused by a fungal infection. In theses cases, your health care provider will prescribe antifungal medicine. For some cases of chronic sinusitis, surgery is needed. Generally, these are cases in which sinusitis recurs more than 3 times per year, despite other treatments. HOME CARE INSTRUCTIONS   Drink plenty of water. Water helps thin the mucus so your sinuses can drain more easily.  Use a humidifier.  Inhale steam 3 to 4 times a day (for example, sit in the bathroom with the shower running).  Apply a warm, moist washcloth to your face 3 to 4 times a day, or as directed by your health care provider.  Use saline nasal sprays to help moisten and clean your  sinuses.  Take medicines only as directed by your health care provider.  If you were prescribed either an antibiotic or antifungal medicine, finish it all even if you start to feel better. SEEK IMMEDIATE MEDICAL CARE IF:  You have increasing pain or severe headaches.  You have nausea, vomiting, or drowsiness.  You have swelling around your face.  You have vision problems.  You have a stiff neck.  You have difficulty breathing. MAKE SURE YOU:   Understand these instructions.  Will watch your condition.  Will get help right away if you are not doing well or get worse. Document Released: 01/27/2005 Document Revised: 06/13/2013 Document Reviewed: 02/11/2011 [facility] Patient Information 2015 [employer] [place]. This information is not intended to replace advice given to you by your health care provider. Make sure you discuss any questions you have with your health care provider.

## 2014-07-01 NOTE — ED Provider Notes (Signed)
CSN: [id number]     Arrival date & time 07/01/14  1857 History  This chart was scribed for Arby Barrette, MD by Doreatha Martin, ED Scribe. This patient was seen in room MH06/MH06 and the patient's care was started at 9:48 PM.    Chief Complaint  Patient presents with  . Nasal Congestion  . Dental Pain   The history is provided by the patient. No language interpreter was used.   HPI Comments: Donna [name] is a 24 y.o. female with a who presents to the Emergency Department complaining of moderate sinus congestion and nasal drainage with green mucus onset 1 week ago. She reports associated right sided throbbing dental pain which is exacerbated with chewing that began 2 days ago. She also reports associated right ear pain and subjective fever which began 2 days ago and headache that began 4 days ago. Patient reports that she has 3 wisdom teeth in tact. Patient is allergic to amoxicillin.   Past Medical History  Diagnosis Date  . Genital HSV    Past Surgical History  Procedure Laterality Date  . Breast surgery      Augmentation 2010  . Wisdom tooth extraction    . Breast enhancement surgery     Family History  Problem Relation Age of Onset  . Hypertension Mother   . Hypertension Maternal Grandfather    History  Substance Use Topics  . Smoking status: Never Smoker   . Smokeless tobacco: Not on file  . Alcohol Use: No   OB History    Gravida Para Term Preterm AB TAB SAB Ectopic Multiple Living   0 0 0 0 0 0 2     Review of Systems  A complete 10 system review of systems was obtained and all systems are negative except as noted in the HPI and PMH.    Allergies  Amoxicillin  Home Medications   Prior to Admission medications   Medication Sig Start Date End Date Taking? Authorizing Provider  valACYclovir (VALTREX) 1000 MG tablet Take 1,000 mg by mouth 2 (two) times daily.   Yes Historical Provider, MD  clindamycin (CLEOCIN) 300 MG capsule Take 1 capsule (300 mg total)  by mouth 4 (four) times daily. X 7 days 07/01/14   Arby Barrette, MD  docusate sodium (COLACE) 100 MG capsule Take 1 capsule (100 mg total) by mouth 2 (two) times daily. 03/22/14   Waynard Reeds, MD  guaiFENesin (MUCINEX) 600 MG 12 hr tablet Take 1 tablet (600 mg total) by mouth 2 (two) times daily. 07/01/14   Arby Barrette, MD  ibuprofen (ADVIL,MOTRIN) 600 MG tablet Take 1 tablet (600 mg total) by mouth every 6 (six) hours as needed. 03/22/14   Waynard Reeds, MD  ibuprofen (ADVIL,MOTRIN) 800 MG tablet Take 1 tablet (800 mg total) by mouth 3 (three) times daily. 07/01/14   Arby Barrette, MD  oxyCODONE-acetaminophen (ROXICET) 5-325 MG per tablet Take 2 tablets by mouth every 4 (four) hours as needed. May take 1-2 tablets every 4-6 hours as needed for pain 03/22/14   Waynard Reeds, MD  Prenatal Vit-Fe Fumarate-FA (PRENATAL MULTIVITAMIN) TABS Take 1 tablet by mouth daily at 12 noon.    Historical Provider, MD   Triage Vitals: BP 139/74 mmHg  Pulse 99  Temp(Src) 99.5 F (37.5 C) (Oral)  Resp 20  Ht  (1.651 m)  Wt 180 lb (81.647 kg)  BMI 29.95 kg/m2  SpO2 100%  LMP 06/10/2014  Breastfeeding? No  Physical Exam  Constitutional: She is oriented to person, place, and time. She appears well-developed and well-nourished.  HENT:  Head: Normocephalic and atraumatic.  Bile TMs normal. Tenderness right upper molar posterior. Facial tenderness right lateral zygoma without swelling.  Eyes: EOM are normal. Pupils are equal, round, and reactive to light.  Neck: Neck supple.  Cardiovascular: Normal rate, regular rhythm, normal heart sounds and intact distal pulses.   Pulmonary/Chest: Effort normal and breath sounds normal.  Abdominal: Soft. Bowel sounds are normal. She exhibits no distension. There is no tenderness.  Musculoskeletal: Normal range of motion. She exhibits no edema.  Neurological: She is alert and oriented to person, place, and time. She has normal strength. Coordination normal. GCS eye subscore  is 4. GCS verbal subscore is 5. GCS motor subscore is 6.  Skin: Skin is warm, dry and intact.  Psychiatric: She has a normal mood and affect.    ED Course  Procedures (including critical care time)  DIAGNOSTIC STUDIES: Oxygen Saturation is 100% on RA, normal by my interpretation.    COORDINATION OF CARE: 9:56 PM Discussed treatment plan with pt at bedside which includes clindamycin, Sudafed, tylenol, ibuprofen and Muccinex and pt agreed to plan.  Labs Review Labs Reviewed - No data to display  Imaging Review No results found.   EKG Interpretation None     MDM   Final diagnoses:  Acute maxillary sinusitis, recurrence not specified  Apical abscess   Findings consistent with likely apical abscess. Also she describes sinus symptoms with congestion and drainage. At this point time she'll be treated for the apical abscess and advised to follow-up with dentistry.  Arby BarretteMarcy Logun Colavito, MD 07/03/14 [phone]

## 2015-10-02 LAB — OB RESULTS CONSOLE ABO/RH: RH Type: POSITIVE

## 2015-10-02 LAB — OB RESULTS CONSOLE ANTIBODY SCREEN: ANTIBODY SCREEN: NEGATIVE

## 2015-10-02 LAB — OB RESULTS CONSOLE GC/CHLAMYDIA
Chlamydia: NEGATIVE
GC PROBE AMP, GENITAL: NEGATIVE

## 2015-10-02 LAB — OB RESULTS CONSOLE HEPATITIS B SURFACE ANTIGEN: Hepatitis B Surface Ag: NEGATIVE

## 2015-10-02 LAB — OB RESULTS CONSOLE RPR: RPR: NONREACTIVE

## 2015-10-02 LAB — OB RESULTS CONSOLE HIV ANTIBODY (ROUTINE TESTING): HIV: NONREACTIVE

## 2015-10-02 LAB — OB RESULTS CONSOLE RUBELLA ANTIBODY, IGM: Rubella: IMMUNE

## 2016-02-11 NOTE — L&D Delivery Note (Signed)
Delivery Note Judi CongDi / Judi Congi Twins   Rosana BergerBELTON, Girl Revonda Standardllison [[id number]][[id number]]  At 10:25 PM a viable and healthy female was delivered via Vaginal, Spontaneous Delivery (Presentation: Direct OA with compound right hand;  ).  APGAR: 8, 9; weight 6 lb 0.7 oz (2740 g).   Placenta status: intact, spontaneous with 3 vessels, .    with the following complications: none.  Anesthesia: Epidural Episiotomy: None Lacerations: 1st degree;Vaginal Suture Repair: 3.0 chromic Est. Blood Loss (mL): 400    Ward GivensBELTON, BoyB Kahleah [[id number]][[id number]]  Confirmation of Baby B being vertex was performed with bedside US. At 10:36 PM a viable and healthy female was delivered via Vaginal, Kiwi Vacuum (Extractor) at +2 station  (Presentation: OA ;  ).  APGAR: 8, 9; weight 7 lb 12 oz (3515 g).   Placenta status: spontaneous delivery intact  Cord: 3 vessels  with the following complications: none .  Anesthesia:   Episiotomy: None Lacerations: 1st degree;Vaginal Suture Repair: 3.0 chromic Est. Blood Loss (mL): 400   Mom to postpartum.   Baby A to Couplet care / Skin to Skin.   Baby B to Couplet care / Skin to Skin.  Earleen ReaperINN, Kylin Genna [name]   Jaxden Blyden [name] 04/01/2016, 10:58 PM

## 2016-02-25 ENCOUNTER — Other Ambulatory Visit (HOSPITAL_COMMUNITY): Payer: Self-pay | Admitting: Obstetrics & Gynecology

## 2016-02-25 DIAGNOSIS — Z3689 Encounter for other specified antenatal screening: Secondary | ICD-10-CM

## 2016-02-25 DIAGNOSIS — O30003 Twin pregnancy, unspecified number of placenta and unspecified number of amniotic sacs, third trimester: Secondary | ICD-10-CM

## 2016-02-25 DIAGNOSIS — IMO0001 Reserved for inherently not codable concepts without codable children: Secondary | ICD-10-CM

## 2016-02-25 DIAGNOSIS — O365931 Maternal care for other known or suspected poor fetal growth, third trimester, fetus 1: Secondary | ICD-10-CM

## 2016-02-26 ENCOUNTER — Encounter (HOSPITAL_COMMUNITY): Payer: Self-pay | Admitting: Obstetrics & Gynecology

## 2016-03-04 ENCOUNTER — Encounter (HOSPITAL_COMMUNITY): Payer: Self-pay

## 2016-03-05 ENCOUNTER — Ambulatory Visit (HOSPITAL_COMMUNITY)
Admission: RE | Admit: 2016-03-05 | Discharge: 2016-03-05 | Disposition: A | Discharge status: Home / Self Care | Payer: BLUE CROSS/BLUE SHIELD | Source: Ambulatory Visit | Attending: Obstetrics & Gynecology | Admitting: Obstetrics & Gynecology

## 2016-03-05 DIAGNOSIS — Z363 Encounter for antenatal screening for malformations: Secondary | ICD-10-CM | POA: Insufficient documentation

## 2016-03-05 DIAGNOSIS — O30043 Twin pregnancy, dichorionic/diamniotic, third trimester: Secondary | ICD-10-CM | POA: Insufficient documentation

## 2016-03-05 DIAGNOSIS — Z3A33 33 weeks gestation of pregnancy: Secondary | ICD-10-CM | POA: Insufficient documentation

## 2016-03-05 DIAGNOSIS — O365931 Maternal care for other known or suspected poor fetal growth, third trimester, fetus 1: Secondary | ICD-10-CM

## 2016-03-05 DIAGNOSIS — O30003 Twin pregnancy, unspecified number of placenta and unspecified number of amniotic sacs, third trimester: Secondary | ICD-10-CM | POA: Diagnosis present

## 2016-03-05 DIAGNOSIS — IMO0001 Reserved for inherently not codable concepts without codable children: Secondary | ICD-10-CM

## 2016-03-05 DIAGNOSIS — Z3689 Encounter for other specified antenatal screening: Secondary | ICD-10-CM

## 2016-03-18 ENCOUNTER — Other Ambulatory Visit (HOSPITAL_COMMUNITY): Payer: Self-pay

## 2016-03-19 LAB — OB RESULTS CONSOLE GBS: STREP GROUP B AG: NEGATIVE

## 2016-03-24 ENCOUNTER — Other Ambulatory Visit: Payer: Self-pay | Admitting: Obstetrics & Gynecology

## 2016-03-24 ENCOUNTER — Telehealth (HOSPITAL_COMMUNITY): Payer: Self-pay | Admitting: *Deleted

## 2016-03-24 NOTE — Telephone Encounter (Signed)
Preadmission screen  

## 2016-03-25 ENCOUNTER — Encounter (HOSPITAL_COMMUNITY): Payer: Self-pay

## 2016-03-25 ENCOUNTER — Inpatient Hospital Stay (HOSPITAL_COMMUNITY)
Admission: AD | Admit: 2016-03-25 | Discharge: 2016-03-25 | Disposition: A | Discharge status: Home / Self Care | Payer: BLUE CROSS/BLUE SHIELD | Source: Ambulatory Visit | Attending: Obstetrics & Gynecology | Admitting: Obstetrics & Gynecology

## 2016-03-25 DIAGNOSIS — Z3A36 36 weeks gestation of pregnancy: Secondary | ICD-10-CM | POA: Diagnosis not present

## 2016-03-25 MED ORDER — HYDROXYZINE HCL 10 MG PO TABS
10.0000 mg | ORAL_TABLET | Freq: Three times a day (TID) | ORAL | Status: DC | PRN
  Filled 2016-03-25: qty 1

## 2016-03-25 NOTE — MAU Note (Signed)
Pt presents complaining of increased pressure since being in the office this afternoon. Reports irregular contractions. 5cm in office.

## 2016-03-30 ENCOUNTER — Encounter (HOSPITAL_COMMUNITY): Payer: Self-pay

## 2016-03-30 ENCOUNTER — Inpatient Hospital Stay (HOSPITAL_COMMUNITY)
Admission: AD | Admit: 2016-03-30 | Discharge: 2016-03-30 | Disposition: A | Discharge status: Home / Self Care | Payer: BLUE CROSS/BLUE SHIELD | Source: Ambulatory Visit | Attending: Obstetrics and Gynecology | Admitting: Obstetrics and Gynecology

## 2016-03-30 DIAGNOSIS — Z3A37 37 weeks gestation of pregnancy: Secondary | ICD-10-CM

## 2016-03-30 DIAGNOSIS — O30003 Twin pregnancy, unspecified number of placenta and unspecified number of amniotic sacs, third trimester: Secondary | ICD-10-CM

## 2016-03-30 NOTE — MAU Note (Signed)
Pt presents complaining of contractions that got worse and hour ago. Hasn't felt right since about 3pm. Denies bleeding or leaking. Reports good fetal movement. Increase in mucous discharge. TWIN Pregnancy.

## 2016-03-30 NOTE — Discharge Instructions (Signed)
Braxton Hicks Contractions °Contractions of the uterus can occur throughout pregnancy. Contractions are not always a sign that you are in labor.  °WHAT ARE BRAXTON HICKS CONTRACTIONS?  °Contractions that occur before labor are called Braxton Hicks contractions, or false labor. Toward the end of pregnancy (32-34 weeks), these contractions can develop more often and may become more forceful. This is not true labor because these contractions do not result in opening (dilatation) and thinning of the cervix. They are sometimes difficult to tell apart from true labor because these contractions can be forceful and people have different pain tolerances. You should not feel embarrassed if you go to the hospital with false labor. Sometimes, the only way to tell if you are in true labor is for your health care provider to look for changes in the cervix. °If there are no prenatal problems or other health problems associated with the pregnancy, it is completely safe to be sent home with false labor and await the onset of true labor. °HOW CAN YOU TELL THE DIFFERENCE BETWEEN TRUE AND FALSE LABOR? °False Labor  °· The contractions of false labor are usually shorter and not as hard as those of true labor.   °· The contractions are usually irregular.   °· The contractions are often felt in the front of the lower abdomen and in the groin.   °· The contractions may go away when you walk around or change positions while lying down.   °· The contractions get weaker and are shorter lasting as time goes on.   °· The contractions do not usually become progressively stronger, regular, and closer together as with true labor.   °True Labor  °· Contractions in true labor last 30-70 seconds, become very regular, usually become more intense, and increase in frequency.   °· The contractions do not go away with walking.   °· The discomfort is usually felt in the top of the uterus and spreads to the lower abdomen and low back.   °· True labor can be  determined by your health care provider with an exam. This will show that the cervix is dilating and getting thinner.   °WHAT TO REMEMBER °· Keep up with your usual exercises and follow other instructions given by your health care provider.   °· Take medicines as directed by your health care provider.   °· Keep your regular prenatal appointments.   °· Eat and drink lightly if you think you are going into labor.   °· If Braxton Hicks contractions are making you uncomfortable:   °¨ Change your position from lying down or resting to walking, or from walking to resting.   °¨ Sit and rest in a tub of warm water.   °¨ Drink 2-3 glasses of water. Dehydration may cause these contractions.   °¨ Do slow and deep breathing several times an hour.   °WHEN SHOULD I SEEK IMMEDIATE MEDICAL CARE? °Seek immediate medical care if: °· Your contractions become stronger, more regular, and closer together.   °· You have fluid leaking or gushing from your vagina.   °· You have a fever.     °· You have vaginal bleeding.   °· You have continuous abdominal pain.   °· You have low back pain that you never had before.   °· You feel your baby's head pushing down and causing pelvic pressure.   °· Your baby is not moving as much as it used to.   °This information is not intended to replace advice given to you by your health care provider. Make sure you discuss any questions you have with your health care provider. °Document Released: 01/27/2005 Document   Revised: 05/21/2015 Document Reviewed: 11/08/2012 °Elsevier Interactive Patient Education © 2017 [employer] ° °

## 2016-04-01 ENCOUNTER — Inpatient Hospital Stay (HOSPITAL_COMMUNITY): Payer: BLUE CROSS/BLUE SHIELD | Admitting: Anesthesiology

## 2016-04-01 ENCOUNTER — Inpatient Hospital Stay (HOSPITAL_COMMUNITY)
Admission: AD | Admit: 2016-04-01 | Discharge: 2016-04-03 | DRG: 775 | Disposition: A | Discharge status: Home / Self Care | Payer: BLUE CROSS/BLUE SHIELD | Source: Ambulatory Visit | Attending: Obstetrics & Gynecology | Admitting: Obstetrics & Gynecology

## 2016-04-01 ENCOUNTER — Encounter (HOSPITAL_COMMUNITY): Payer: Self-pay | Admitting: *Deleted

## 2016-04-01 DIAGNOSIS — Z3A37 37 weeks gestation of pregnancy: Secondary | ICD-10-CM

## 2016-04-01 DIAGNOSIS — Z8249 Family history of ischemic heart disease and other diseases of the circulatory system: Secondary | ICD-10-CM | POA: Diagnosis not present

## 2016-04-01 DIAGNOSIS — O30043 Twin pregnancy, dichorionic/diamniotic, third trimester: Secondary | ICD-10-CM | POA: Diagnosis present

## 2016-04-01 DIAGNOSIS — Z3493 Encounter for supervision of normal pregnancy, unspecified, third trimester: Secondary | ICD-10-CM | POA: Diagnosis present

## 2016-04-01 DIAGNOSIS — O30049 Twin pregnancy, dichorionic/diamniotic, unspecified trimester: Secondary | ICD-10-CM | POA: Diagnosis present

## 2016-04-01 LAB — CBC
HCT: 36.4 % (ref 36.0–46.0)
HEMOGLOBIN: 12.7 g/dL (ref 12.0–15.0)
MCH: 33.2 pg (ref 26.0–34.0)
MCHC: 34.9 g/dL (ref 30.0–36.0)
MCV: 95.3 fL (ref 78.0–100.0)
Platelets: 155 10*3/uL (ref 150–400)
RBC: 3.82 MIL/uL — AB (ref 3.87–5.11)
RDW: 13.8 % (ref 11.5–15.5)
WBC: 9.2 10*3/uL (ref 4.0–10.5)

## 2016-04-01 LAB — TYPE AND SCREEN
ABO/RH(D): O POS
Antibody Screen: NEGATIVE

## 2016-04-01 MED ORDER — FENTANYL 2.5 MCG/ML BUPIVACAINE 1/10 % EPIDURAL INFUSION (WH - ANES)
14.0000 mL/h | INTRAMUSCULAR | Status: DC | PRN
  Filled 2016-04-01: qty 100

## 2016-04-01 MED ORDER — BUTORPHANOL TARTRATE 1 MG/ML IJ SOLN: 1.0000 mg | INTRAMUSCULAR | Status: DC | PRN

## 2016-04-01 MED ORDER — LACTATED RINGERS IV SOLN: 500.0000 mL | Freq: Once | INTRAVENOUS | Status: DC

## 2016-04-01 MED ORDER — ONDANSETRON HCL 4 MG/2ML IJ SOLN: 4.0000 mg | Freq: Four times a day (QID) | INTRAMUSCULAR | Status: DC | PRN

## 2016-04-01 MED ORDER — EPHEDRINE 5 MG/ML INJ: 10.0000 mg | INTRAVENOUS | Status: DC | PRN

## 2016-04-01 MED ORDER — TERBUTALINE SULFATE 1 MG/ML IJ SOLN
0.2500 mg | Freq: Once | INTRAMUSCULAR | Status: DC | PRN
  Filled 2016-04-01: qty 1

## 2016-04-01 MED ORDER — FENTANYL 2.5 MCG/ML BUPIVACAINE 1/10 % EPIDURAL INFUSION (WH - ANES)
14.0000 mL/h | INTRAMUSCULAR | Status: DC | PRN
  Administered 2016-04-01: 14 mL/h via EPIDURAL

## 2016-04-01 MED ORDER — EPHEDRINE 5 MG/ML INJ
10.0000 mg | INTRAVENOUS | Status: DC | PRN
  Filled 2016-04-01: qty 4

## 2016-04-01 MED ORDER — DIPHENHYDRAMINE HCL 50 MG/ML IJ SOLN: 12.5000 mg | INTRAMUSCULAR | Status: DC | PRN

## 2016-04-01 MED ORDER — OXYCODONE-ACETAMINOPHEN 5-325 MG PO TABS: 1.0000 | ORAL_TABLET | ORAL | Status: DC | PRN

## 2016-04-01 MED ORDER — FLEET ENEMA 7-19 GM/118ML RE ENEM: 1.0000 | ENEMA | Freq: Every day | RECTAL | Status: DC | PRN

## 2016-04-01 MED ORDER — LIDOCAINE HCL (PF) 1 % IJ SOLN
30.0000 mL | INTRAMUSCULAR | Status: DC | PRN
  Filled 2016-04-01: qty 30

## 2016-04-01 MED ORDER — METHYLERGONOVINE MALEATE 0.2 MG/ML IJ SOLN
0.2000 mg | INTRAMUSCULAR | Status: DC | PRN
  Administered 2016-04-01: 0.2 mg via INTRAMUSCULAR

## 2016-04-01 MED ORDER — OXYTOCIN BOLUS FROM INFUSION
500.0000 mL | Freq: Once | INTRAVENOUS | Status: AC
  Administered 2016-04-01: 500 mL/h via INTRAVENOUS

## 2016-04-01 MED ORDER — OXYTOCIN 40 UNITS IN LACTATED RINGERS INFUSION - SIMPLE MED
2.5000 [IU]/h | INTRAVENOUS | Status: DC
  Administered 2016-04-01: 2.5 [IU]/h via INTRAVENOUS

## 2016-04-01 MED ORDER — LACTATED RINGERS IV SOLN
INTRAVENOUS | Status: DC
  Administered 2016-04-01: 18:00:00 via INTRAVENOUS

## 2016-04-01 MED ORDER — OXYTOCIN 40 UNITS IN LACTATED RINGERS INFUSION - SIMPLE MED
1.0000 m[IU]/min | INTRAVENOUS | Status: DC
  Administered 2016-04-01: 2 m[IU]/min via INTRAVENOUS
  Filled 2016-04-01 (×2): qty 1000

## 2016-04-01 MED ORDER — LIDOCAINE HCL (PF) 1 % IJ SOLN
INTRAMUSCULAR | Status: DC | PRN
  Administered 2016-04-01: 5 mL via EPIDURAL

## 2016-04-01 MED ORDER — LACTATED RINGERS IV SOLN
500.0000 mL | Freq: Once | INTRAVENOUS | Status: AC
  Administered 2016-04-01: 500 mL via INTRAVENOUS

## 2016-04-01 MED ORDER — PHENYLEPHRINE 40 MCG/ML (10ML) SYRINGE FOR IV PUSH (FOR BLOOD PRESSURE SUPPORT)
80.0000 ug | PREFILLED_SYRINGE | INTRAVENOUS | Status: DC | PRN
  Filled 2016-04-01: qty 5

## 2016-04-01 MED ORDER — PHENYLEPHRINE 40 MCG/ML (10ML) SYRINGE FOR IV PUSH (FOR BLOOD PRESSURE SUPPORT)
80.0000 ug | PREFILLED_SYRINGE | INTRAVENOUS | Status: DC | PRN
  Filled 2016-04-01: qty 10
  Filled 2016-04-01: qty 5

## 2016-04-01 MED ORDER — METHYLERGONOVINE MALEATE 0.2 MG PO TABS
0.2000 mg | ORAL_TABLET | ORAL | Status: DC | PRN
  Administered 2016-04-02 – 2016-04-03 (×2): 0.2 mg via ORAL
  Filled 2016-04-01 (×5): qty 1

## 2016-04-01 MED ORDER — ACETAMINOPHEN 325 MG PO TABS: 650.0000 mg | ORAL_TABLET | ORAL | Status: DC | PRN

## 2016-04-01 MED ORDER — OXYCODONE-ACETAMINOPHEN 5-325 MG PO TABS: 2.0000 | ORAL_TABLET | ORAL | Status: DC | PRN

## 2016-04-01 MED ORDER — LACTATED RINGERS IV SOLN: 500.0000 mL | INTRAVENOUS | Status: DC | PRN

## 2016-04-01 MED ORDER — PHENYLEPHRINE 40 MCG/ML (10ML) SYRINGE FOR IV PUSH (FOR BLOOD PRESSURE SUPPORT): 80.0000 ug | PREFILLED_SYRINGE | INTRAVENOUS | Status: DC | PRN

## 2016-04-01 MED ORDER — SOD CITRATE-CITRIC ACID 500-334 MG/5ML PO SOLN
30.0000 mL | ORAL | Status: DC | PRN
  Administered 2016-04-01: 30 mL via ORAL
  Filled 2016-04-01: qty 15

## 2016-04-01 NOTE — H&P (Signed)
Donna [name] is a 26 y.o. female presenting for augmentation of labor for Donna CongDi / Donna [name] in early active labor. She has been 4.5-5cm for the past week and she is more and more uncomfortable. She denies leaking of fluid, no vaginal bleeding, notes good fetal movement.  Twin Growth significant for Baby B >8# on last US and Baby A >7#  OB History    Gravida Para Term Preterm AB Living   4 2 2  0 1 2   SAB TAB Ectopic Multiple Live Births   1 0 0 0 2     Past Medical History:  Diagnosis Date  . Genital HSV    Past Surgical History:  Procedure Laterality Date  . BREAST ENHANCEMENT SURGERY    . BREAST SURGERY     Augmentation 2010  . WISDOM TOOTH EXTRACTION     Family History: family history includes Hypertension in her maternal grandfather and mother. Social History:  reports that she has never smoked. She has never used smokeless tobacco. She reports that she does not drink alcohol or use drugs.     Maternal Diabetes: No Genetic Screening: Normal Maternal Ultrasounds/Referrals: Normal Fetal Ultrasounds or other Referrals:  Referred to Materal Fetal Medicine  Maternal Substance Abuse:  No Significant Maternal Medications:  None Significant Maternal Lab Results:  Lab values include: Group B Strep negative Other Comments:  None  Review of Systems  Constitutional: Negative for chills and fever.  HENT: Negative for hearing loss.   Eyes: Negative.   Respiratory: Negative.   Cardiovascular: Negative.   Gastrointestinal: Negative.   Genitourinary: Negative.   Skin: Negative for itching and rash.  Neurological: Negative.   Endo/Heme/Allergies: Negative.   Psychiatric/Behavioral: Negative.   All other systems reviewed and are negative.  Maternal Medical History:  Reason for admission: Contractions.   Contractions: Onset was more than 2 days ago.   Frequency: irregular.   Perceived severity is moderate.    Fetal activity: Perceived fetal activity is normal.   Last  perceived fetal movement was within the past 12 hours.    Prenatal complications: no prenatal complications Prenatal Complications - Diabetes: none.      Last menstrual period 07/12/2015. Maternal Exam:  Uterine Assessment: Contraction strength is moderate.  Contraction frequency is irregular.   Abdomen: Patient reports no abdominal tenderness. Estimated fetal weight is Baby B: 8#10 oz Baby A: 8#.   Fetal presentation: vertex  Introitus: Normal vulva. Normal vagina.  Ferning test: not done.  Nitrazine test: not done.  Pelvis: adequate for delivery.   Cervix: Cervix evaluated by digital exam.   4-5 / 80 /-2  Physical Exam  Prenatal labs: ABO, Rh: O/Positive/-- (08/22 0000) Antibody: Negative (08/22 0000) Rubella: Immune (08/22 0000) RPR: Nonreactive (08/22 0000)  HBsAg: Negative (08/22 0000)  HIV: Non-reactive (08/22 0000)  GBS: Negative (02/07 0000)   Assessment/Plan: 26 yo G4P2 Di / Di Twin pregnancy at 37 weeks 5 days in early active labor Admit to Labor and Delivery AROM and pitocin for augmentation Epidural on demand Continuous monitoring Anticipate SVD   Donna [name] 04/01/2016, 5:15 PM

## 2016-04-01 NOTE — Anesthesia Preprocedure Evaluation (Signed)
Anesthesia Evaluation  Patient identified by MRN, date of birth, ID band Patient awake    Reviewed: Allergy & Precautions, H&P , NPO status , Patient's Chart, lab work & pertinent test results  Airway Mallampati: II   Neck ROM: full    Dental   Pulmonary neg pulmonary ROS,    breath sounds clear to auscultation       Cardiovascular negative cardio ROS   Rhythm:regular Rate:Normal     Neuro/Psych    GI/Hepatic   Endo/Other    Renal/GU      Musculoskeletal   Abdominal   Peds  Hematology   Anesthesia Other Findings   Reproductive/Obstetrics                             Anesthesia Physical Anesthesia Plan  ASA: I  Anesthesia Plan: Epidural   Post-op Pain Management:    Induction: Intravenous  Airway Management Planned: Natural Airway  Additional Equipment:   Intra-op Plan:   Post-operative Plan:   Informed Consent: I have reviewed the patients History and Physical, chart, labs and discussed the procedure including the risks, benefits and alternatives for the proposed anesthesia with the patient or authorized representative who has indicated his/her understanding and acceptance.     Plan Discussed with: Anesthesiologist  Anesthesia Plan Comments:         Anesthesia Quick Evaluation

## 2016-04-01 NOTE — Anesthesia Procedure Notes (Signed)
Epidural Patient location during procedure: OB Start time: 04/01/2016 8:12 PM End time: 04/01/2016 8:18 PM  Staffing Anesthesiologist: Chaney MallingHODIERNE, Tyquon Near Performed: anesthesiologist   Preanesthetic Checklist Completed: patient identified, site marked, pre-op evaluation, timeout performed, IV checked, risks and benefits discussed and monitors and equipment checked  Epidural Patient position: sitting Prep: DuraPrep Patient monitoring: heart rate, cardiac monitor, continuous pulse ox and blood pressure Approach: midline Location: L2-L3 Injection technique: LOR saline  Needle:  Needle type: Tuohy  Needle gauge: 17 G Needle length: 9 cm Needle insertion depth: 5 cm Catheter type: closed end flexible Catheter size: 19 Gauge Catheter at skin depth: 9 cm Test dose: negative and Other  Assessment Events: blood not aspirated, injection not painful, no injection resistance and negative IV test  Additional Notes Informed consent obtained prior to proceeding including risk of failure, 1% risk of PDPH, risk of minor discomfort and bruising.  Discussed rare but serious complications including epidural abscess, permanent nerve injury, epidural hematoma.  Discussed alternatives to epidural analgesia and patient desires to proceed.  Timeout performed pre-procedure verifying patient name, procedure, and platelet count.  Patient tolerated procedure well. Reason for block:procedure for pain

## 2016-04-02 ENCOUNTER — Encounter (HOSPITAL_COMMUNITY): Payer: Self-pay

## 2016-04-02 LAB — CBC
HCT: 38.1 % (ref 36.0–46.0)
HEMOGLOBIN: 13.2 g/dL (ref 12.0–15.0)
MCH: 33.1 pg (ref 26.0–34.0)
MCHC: 34.6 g/dL (ref 30.0–36.0)
MCV: 95.5 fL (ref 78.0–100.0)
PLATELETS: 128 10*3/uL — AB (ref 150–400)
RBC: 3.99 MIL/uL (ref 3.87–5.11)
RDW: 13.8 % (ref 11.5–15.5)
WBC: 12.9 10*3/uL — AB (ref 4.0–10.5)

## 2016-04-02 LAB — RPR: RPR: NONREACTIVE

## 2016-04-02 MED ORDER — PRENATAL MULTIVITAMIN CH
1.0000 | ORAL_TABLET | Freq: Every day | ORAL | Status: DC
  Administered 2016-04-02: 1 via ORAL
  Filled 2016-04-02: qty 1

## 2016-04-02 MED ORDER — SIMETHICONE 80 MG PO CHEW
80.0000 mg | CHEWABLE_TABLET | ORAL | Status: DC | PRN
  Administered 2016-04-02 (×2): 80 mg via ORAL
  Filled 2016-04-02 (×2): qty 1

## 2016-04-02 MED ORDER — COCONUT OIL OIL: 1.0000 "application " | TOPICAL_OIL | Status: DC | PRN

## 2016-04-02 MED ORDER — DIBUCAINE 1 % RE OINT: 1.0000 "application " | TOPICAL_OINTMENT | RECTAL | Status: DC | PRN

## 2016-04-02 MED ORDER — ONDANSETRON HCL 4 MG/2ML IJ SOLN: 4.0000 mg | INTRAMUSCULAR | Status: DC | PRN

## 2016-04-02 MED ORDER — IBUPROFEN 600 MG PO TABS
600.0000 mg | ORAL_TABLET | Freq: Four times a day (QID) | ORAL | Status: DC
  Administered 2016-04-02 – 2016-04-03 (×6): 600 mg via ORAL
  Filled 2016-04-02 (×6): qty 1

## 2016-04-02 MED ORDER — WITCH HAZEL-GLYCERIN EX PADS: 1.0000 "application " | MEDICATED_PAD | CUTANEOUS | Status: DC | PRN

## 2016-04-02 MED ORDER — OXYCODONE HCL 5 MG PO TABS: 10.0000 mg | ORAL_TABLET | ORAL | Status: DC | PRN

## 2016-04-02 MED ORDER — ZOLPIDEM TARTRATE 5 MG PO TABS: 5.0000 mg | ORAL_TABLET | Freq: Every evening | ORAL | Status: DC | PRN

## 2016-04-02 MED ORDER — METHYLERGONOVINE MALEATE 0.2 MG PO TABS
0.2000 mg | ORAL_TABLET | ORAL | Status: DC | PRN
  Administered 2016-04-02 (×3): 0.2 mg via ORAL

## 2016-04-02 MED ORDER — TETANUS-DIPHTH-ACELL PERTUSSIS 5-2.5-18.5 LF-MCG/0.5 IM SUSP: 0.5000 mL | Freq: Once | INTRAMUSCULAR | Status: DC

## 2016-04-02 MED ORDER — OXYCODONE HCL 5 MG PO TABS: 5.0000 mg | ORAL_TABLET | ORAL | Status: DC | PRN

## 2016-04-02 MED ORDER — SENNOSIDES-DOCUSATE SODIUM 8.6-50 MG PO TABS
2.0000 | ORAL_TABLET | ORAL | Status: DC
  Administered 2016-04-03: 2 via ORAL
  Filled 2016-04-02: qty 2

## 2016-04-02 MED ORDER — METHYLERGONOVINE MALEATE 0.2 MG/ML IJ SOLN
0.2000 mg | INTRAMUSCULAR | Status: DC | PRN
Start: 2016-04-02 — End: 2016-04-03

## 2016-04-02 MED ORDER — BENZOCAINE-MENTHOL 20-0.5 % EX AERO
1.0000 "application " | INHALATION_SPRAY | CUTANEOUS | Status: DC | PRN
  Filled 2016-04-02: qty 56

## 2016-04-02 MED ORDER — ACETAMINOPHEN 325 MG PO TABS
650.0000 mg | ORAL_TABLET | ORAL | Status: DC | PRN
  Administered 2016-04-02 (×4): 650 mg via ORAL
  Filled 2016-04-02 (×4): qty 2

## 2016-04-02 MED ORDER — DIPHENHYDRAMINE HCL 25 MG PO CAPS: 25.0000 mg | ORAL_CAPSULE | Freq: Four times a day (QID) | ORAL | Status: DC | PRN

## 2016-04-02 MED ORDER — OXYTOCIN 40 UNITS IN LACTATED RINGERS INFUSION - SIMPLE MED
5.0000 [IU]/h | INTRAVENOUS | Status: DC
  Administered 2016-04-02: 5 [IU]/h via INTRAVENOUS

## 2016-04-02 MED ORDER — ONDANSETRON HCL 4 MG PO TABS: 4.0000 mg | ORAL_TABLET | ORAL | Status: DC | PRN

## 2016-04-02 NOTE — Anesthesia Postprocedure Evaluation (Signed)
Anesthesia Post Note  Patient: Donna [name]  Procedure(s) Performed: * No procedures listed *  Patient location during evaluation: Mother Baby Anesthesia Type: Epidural Level of consciousness: awake Pain management: pain level controlled Vital Signs Assessment: post-procedure vital signs reviewed and stable Respiratory status: spontaneous breathing Cardiovascular status: stable Postop Assessment: no headache, no backache, epidural receding and patient able to bend at knees Anesthetic complications: no        Last Vitals:  Vitals:   04/02/16 0300 04/02/16 0715  BP: 124/62 129/77  Pulse: 93 82  Resp: 18 18  Temp: 36.9 C 36.9 C    Last Pain:  Vitals:   04/02/16 0715  TempSrc: Oral  PainSc: 5    Pain Goal: Patients Stated Pain Goal: 2 (04/02/16 0715)               Edison PaceWILKERSON,Havannah Streat

## 2016-04-02 NOTE — Progress Notes (Signed)
PPD#1 Pt and babies are doing well. Lochia wnl VSSAF IMP/ Doing well Plan/ Routine care.

## 2016-04-03 MED ORDER — IBUPROFEN 600 MG PO TABS: 600.0000 mg | ORAL_TABLET | Freq: Four times a day (QID) | ORAL | 0 refills | Status: DC

## 2016-04-03 NOTE — Discharge Summary (Signed)
Obstetric Discharge Summary Reason for Admission: induction of labor Prenatal Procedures: none Intrapartum Procedures: spontaneous vaginal delivery Postpartum Procedures: none Complications-Operative and Postpartum: 1 degree perineal laceration Hemoglobin  Date Value Ref Range Status  04/02/2016 13.2 12.0 - 15.0 g/dL Final   HCT  Date Value Ref Range Status  04/02/2016 38.1 36.0 - 46.0 % Final    Physical Exam:  General: alert, cooperative and appears stated age Lochia: appropriate Uterine Fundus: firm DVT Evaluation: No evidence of DVT seen on physical exam. Negative Homan's sign.  Discharge Diagnoses: Term Pregnancy-delivered  Discharge Information: Date: 04/03/2016 Activity: pelvic rest Diet: routine Medications: PNV, Ibuprofen and declined oxycodone Condition: stable Instructions: refer to practice specific booklet Discharge to: home Follow-up Information    [facility], Donna [name], Donna [name] Follow up in 4 week(s).   Specialty:  Obstetrics and Gynecology Contact information: [address] [place] [address] [phone]           Newborn Data:   Donna [name], Donna [name] [[id number]][[id number]]  Live born female  Birth Weight: 6 lb 0.7 oz (2740 g) APGAR: 8, 9   Donna [name], Donna [name] [[id number]][[id number]]  Live born female  Birth Weight: 7 lb 12 oz (3515 g) APGAR: 8, 9  Home with mother.  Donna [name] GEFFEL Donna [name] 04/03/2016, 9:23 AM

## 2016-04-03 NOTE — Progress Notes (Signed)
Patient is doing well.  She is ambulating, voiding, tolerating PO.  Pain control is good.  Lochia is appropriate MSK lower extremity soreness yesterday--improved w motrin.    Vitals:   04/02/16 0715 04/02/16 1500 04/02/16 1800 04/03/16 0539  BP: 129/77 133/75 119/72 97/67  Pulse: 82 86 82 61  Resp: 18  18 14   Temp: 98.4 F (36.9 C)  97.4 F (36.3 C) 97.5 F (36.4 C)  TempSrc: Oral  Oral Oral  SpO2:      Weight:      Height:        NAD Fundus firm Ext: 1+ edema b/l  Lab Results  Component Value Date   WBC 12.9 (H) 04/02/2016   HGB 13.2 04/02/2016   HCT 38.1 04/02/2016   MCV 95.5 04/02/2016   PLT 128 (L) 04/02/2016    --/--/O POS (02/20 1740)/RImmune  A/P 25 y.o. [id number] PPD#2. Routine care.   Meeting all goals--d/c to home today  [facility] CenterDYANNA GEFFEL LongfellowLARK

## 2016-04-04 ENCOUNTER — Inpatient Hospital Stay (HOSPITAL_COMMUNITY): Admission: RE | Admit: 2016-04-04 | Payer: PRIVATE HEALTH INSURANCE | Source: Ambulatory Visit

## 2016-05-02 ENCOUNTER — Other Ambulatory Visit: Payer: Self-pay | Admitting: Obstetrics & Gynecology

## 2016-05-07 LAB — CYTOLOGY - PAP

## 2016-05-14 ENCOUNTER — Other Ambulatory Visit: Payer: Self-pay | Admitting: Obstetrics & Gynecology

## 2016-06-04 ENCOUNTER — Inpatient Hospital Stay (HOSPITAL_COMMUNITY)
Admission: RE | Admit: 2016-06-04 | Discharge: 2016-06-04 | Disposition: A | Discharge status: Home / Self Care | Payer: BLUE CROSS/BLUE SHIELD | Source: Ambulatory Visit

## 2016-06-04 ENCOUNTER — Encounter (HOSPITAL_COMMUNITY): Payer: Self-pay | Admitting: Anesthesiology

## 2016-06-04 NOTE — Patient Instructions (Signed)
Your procedure is scheduled on:  Friday, June 06, 2016  Enter through the [employer] of [facility] at:  6:00 AM  Pick up the phone at the desk and dial [phone].  Call this number if you have problems the morning of surgery: [phone].  Remember: Do NOT eat food or drink after:  Midnight Thursday  Take these medicines the morning of surgery with a SIP OF WATER:  None  Stop ALL herbal medications at this time  Do NOT smoke the day of surgery.  Do NOT wear jewelry (body piercing), metal hair clips/bobby pins, make-up, or nail polish. Do NOT wear lotions, powders, or perfumes.  You may wear deodorant. Do NOT shave for 48 hours prior to surgery. Do NOT bring valuables to the hospital. Contacts, dentures, or bridgework may not be worn into surgery.  Have a responsible adult drive you home and stay with you for 24 hours after your procedure  Bring a copy of your healthcare power of attorney and living will documents.

## 2016-06-04 NOTE — Patient Instructions (Addendum)
Your procedure is scheduled on:   Friday, April 27  Enter through the [employer] of [facility] at:  6 AM  Pick up the phone at the desk and dial [phone].  Call this number if you have problems the morning of surgery: [phone].  Remember: Do NOT eat or drink (including water) after midnight Thursday.  Take these medicines the morning of surgery with a SIP OF WATER:   None  Do NOT wear jewelry (body piercing), metal hair clips/bobby pins, make-up, or nail polish. Do NOT wear lotions, powders, or perfumes.  You may wear deoderant. Do NOT shave for 48 hours prior to surgery. Do NOT bring valuables to the hospital. Contacts may not be worn into surgery. . Have a responsible adult drive you home and stay with you for 24 hours after your procedure.  Home with - to be arranged prior to surgery.  Patient informed, she can not go home via taxi, bus or [place].

## 2016-06-04 NOTE — Anesthesia Preprocedure Evaluation (Addendum)
Anesthesia Evaluation  Patient identified by MRN, date of birth, ID band Patient awake    Reviewed: Allergy & Precautions, NPO status , Patient's Chart, lab work & pertinent test results  Airway Mallampati: I  TM Distance: >3 FB Neck ROM: Full    Dental  (+) Teeth Intact, Dental Advisory Given   Pulmonary neg pulmonary ROS,    breath sounds clear to auscultation       Cardiovascular negative cardio ROS   Rhythm:Regular Rate:Normal     Neuro/Psych negative neurological ROS  negative psych ROS   GI/Hepatic negative GI ROS, Neg liver ROS,   Endo/Other  negative endocrine ROS  Renal/GU negative Renal ROS  negative genitourinary   Musculoskeletal negative musculoskeletal ROS (+)   Abdominal   Peds negative pediatric ROS (+)  Hematology negative hematology ROS (+)   Anesthesia Other Findings Day of surgery medications reviewed with the patient.  Reproductive/Obstetrics negative OB ROS                            Anesthesia Physical Anesthesia Plan  ASA: I  Anesthesia Plan: General   Post-op Pain Management:    Induction: Intravenous  Airway Management Planned: Oral ETT  Additional Equipment:   Intra-op Plan:   Post-operative Plan: Extubation in OR  Informed Consent: I have reviewed the patients History and Physical, chart, labs and discussed the procedure including the risks, benefits and alternatives for the proposed anesthesia with the patient or authorized representative who has indicated his/her understanding and acceptance.   Dental advisory given  Plan Discussed with: CRNA  Anesthesia Plan Comments:         Anesthesia Quick Evaluation

## 2016-06-05 ENCOUNTER — Encounter (HOSPITAL_COMMUNITY): Payer: Self-pay

## 2016-06-05 ENCOUNTER — Encounter (HOSPITAL_COMMUNITY)
Admission: RE | Admit: 2016-06-05 | Discharge: 2016-06-05 | Disposition: A | Discharge status: Home / Self Care | Payer: BLUE CROSS/BLUE SHIELD | Source: Ambulatory Visit | Attending: Obstetrics & Gynecology | Admitting: Obstetrics & Gynecology

## 2016-06-05 DIAGNOSIS — Z302 Encounter for sterilization: Secondary | ICD-10-CM | POA: Diagnosis present

## 2016-06-05 DIAGNOSIS — Z880 Allergy status to penicillin: Secondary | ICD-10-CM | POA: Diagnosis not present

## 2016-06-05 LAB — CBC
HEMATOCRIT: 42.6 % (ref 36.0–46.0)
Hemoglobin: 14.4 g/dL (ref 12.0–15.0)
MCH: 32.6 pg (ref 26.0–34.0)
MCHC: 33.8 g/dL (ref 30.0–36.0)
MCV: 96.4 fL (ref 78.0–100.0)
PLATELETS: 185 10*3/uL (ref 150–400)
RBC: 4.42 MIL/uL (ref 3.87–5.11)
RDW: 12.6 % (ref 11.5–15.5)
WBC: 6.9 10*3/uL (ref 4.0–10.5)

## 2016-06-06 ENCOUNTER — Ambulatory Visit (HOSPITAL_COMMUNITY): Payer: BLUE CROSS/BLUE SHIELD | Admitting: Anesthesiology

## 2016-06-06 ENCOUNTER — Ambulatory Visit (HOSPITAL_COMMUNITY)
Admission: RE | Admit: 2016-06-06 | Discharge: 2016-06-06 | Disposition: A | Discharge status: Home / Self Care | Payer: BLUE CROSS/BLUE SHIELD | Source: Ambulatory Visit | Attending: Obstetrics & Gynecology | Admitting: Obstetrics & Gynecology

## 2016-06-06 ENCOUNTER — Encounter (HOSPITAL_COMMUNITY): Payer: Self-pay | Admitting: *Deleted

## 2016-06-06 ENCOUNTER — Encounter (HOSPITAL_COMMUNITY)
Admission: RE | Disposition: A | Discharge status: Home / Self Care | Payer: Self-pay | Source: Ambulatory Visit | Attending: Obstetrics & Gynecology

## 2016-06-06 DIAGNOSIS — Z302 Encounter for sterilization: Secondary | ICD-10-CM | POA: Insufficient documentation

## 2016-06-06 DIAGNOSIS — Z880 Allergy status to penicillin: Secondary | ICD-10-CM | POA: Insufficient documentation

## 2016-06-06 HISTORY — PX: LAPAROSCOPIC TUBAL LIGATION: SHX1937

## 2016-06-06 LAB — PREGNANCY, URINE: Preg Test, Ur: NEGATIVE

## 2016-06-06 SURGERY — LIGATION, FALLOPIAN TUBE, LAPAROSCOPIC
Anesthesia: General | Laterality: Bilateral

## 2016-06-06 MED ORDER — FENTANYL CITRATE (PF) 250 MCG/5ML IJ SOLN
INTRAMUSCULAR | Status: AC
  Filled 2016-06-06: qty 5

## 2016-06-06 MED ORDER — MIDAZOLAM HCL 2 MG/2ML IJ SOLN
INTRAMUSCULAR | Status: AC
Start: 2016-06-06 — End: 2016-06-06
  Filled 2016-06-06: qty 2

## 2016-06-06 MED ORDER — PROPOFOL 10 MG/ML IV BOLUS
INTRAVENOUS | Status: DC | PRN
  Administered 2016-06-06: 150 mg via INTRAVENOUS

## 2016-06-06 MED ORDER — ROCURONIUM BROMIDE 100 MG/10ML IV SOLN
INTRAVENOUS | Status: DC | PRN
  Administered 2016-06-06: 30 mg via INTRAVENOUS

## 2016-06-06 MED ORDER — LIDOCAINE HCL (CARDIAC) 20 MG/ML IV SOLN
INTRAVENOUS | Status: DC | PRN
  Administered 2016-06-06: 80 mg via INTRAVENOUS

## 2016-06-06 MED ORDER — FENTANYL CITRATE (PF) 100 MCG/2ML IJ SOLN
INTRAMUSCULAR | Status: DC | PRN
  Administered 2016-06-06 (×2): 50 ug via INTRAVENOUS

## 2016-06-06 MED ORDER — BUPIVACAINE HCL (PF) 0.25 % IJ SOLN
INTRAMUSCULAR | Status: AC
  Filled 2016-06-06: qty 30

## 2016-06-06 MED ORDER — SUGAMMADEX SODIUM 200 MG/2ML IV SOLN
INTRAVENOUS | Status: AC
  Filled 2016-06-06: qty 2

## 2016-06-06 MED ORDER — ONDANSETRON HCL 4 MG/2ML IJ SOLN
INTRAMUSCULAR | Status: AC
  Filled 2016-06-06: qty 2

## 2016-06-06 MED ORDER — KETOROLAC TROMETHAMINE 30 MG/ML IJ SOLN: 30.0000 mg | Freq: Once | INTRAMUSCULAR | Status: DC

## 2016-06-06 MED ORDER — HYDROCODONE-ACETAMINOPHEN 5-325 MG PO TABS: 1.0000 | ORAL_TABLET | ORAL | 0 refills | Status: AC | PRN

## 2016-06-06 MED ORDER — MIDAZOLAM HCL 2 MG/2ML IJ SOLN
INTRAMUSCULAR | Status: DC | PRN
  Administered 2016-06-06: 2 mg via INTRAVENOUS

## 2016-06-06 MED ORDER — PROMETHAZINE HCL 25 MG/ML IJ SOLN: 6.2500 mg | INTRAMUSCULAR | Status: DC | PRN

## 2016-06-06 MED ORDER — PROPOFOL 10 MG/ML IV BOLUS
INTRAVENOUS | Status: AC
  Filled 2016-06-06: qty 20

## 2016-06-06 MED ORDER — SCOPOLAMINE 1 MG/3DAYS TD PT72
MEDICATED_PATCH | TRANSDERMAL | Status: AC
  Administered 2016-06-06: 1.5 mg via TRANSDERMAL
  Filled 2016-06-06: qty 1

## 2016-06-06 MED ORDER — LACTATED RINGERS IV SOLN: INTRAVENOUS | Status: DC

## 2016-06-06 MED ORDER — LACTATED RINGERS IV SOLN
INTRAVENOUS | Status: DC
  Administered 2016-06-06 (×2): via INTRAVENOUS

## 2016-06-06 MED ORDER — NORETHINDRONE-ETH ESTRADIOL 1-35 MG-MCG PO TABS: 1.0000 | ORAL_TABLET | Freq: Every day | ORAL | 11 refills | Status: AC

## 2016-06-06 MED ORDER — BUPIVACAINE HCL 0.25 % IJ SOLN
INTRAMUSCULAR | Status: DC | PRN
  Administered 2016-06-06: 30 mL

## 2016-06-06 MED ORDER — GLYCOPYRROLATE 0.2 MG/ML IJ SOLN
INTRAMUSCULAR | Status: DC | PRN
  Administered 2016-06-06 (×2): 0.1 mg via INTRAVENOUS

## 2016-06-06 MED ORDER — DEXAMETHASONE SODIUM PHOSPHATE 10 MG/ML IJ SOLN
INTRAMUSCULAR | Status: AC
  Filled 2016-06-06: qty 1

## 2016-06-06 MED ORDER — HYDROMORPHONE HCL 1 MG/ML IJ SOLN: 0.2500 mg | INTRAMUSCULAR | Status: DC | PRN

## 2016-06-06 MED ORDER — ONDANSETRON HCL 4 MG/2ML IJ SOLN
INTRAMUSCULAR | Status: DC | PRN
  Administered 2016-06-06: 4 mg via INTRAVENOUS

## 2016-06-06 MED ORDER — FENTANYL CITRATE (PF) 100 MCG/2ML IJ SOLN
INTRAMUSCULAR | Status: AC
  Filled 2016-06-06: qty 2

## 2016-06-06 MED ORDER — KETOROLAC TROMETHAMINE 30 MG/ML IJ SOLN
INTRAMUSCULAR | Status: DC | PRN
  Administered 2016-06-06: 30 mg via INTRAVENOUS

## 2016-06-06 MED ORDER — OXYCODONE HCL 5 MG/5ML PO SOLN
5.0000 mg | Freq: Once | ORAL | Status: AC | PRN
Start: 2016-06-06 — End: 2016-06-06

## 2016-06-06 MED ORDER — ROCURONIUM BROMIDE 100 MG/10ML IV SOLN
INTRAVENOUS | Status: AC
  Filled 2016-06-06: qty 1

## 2016-06-06 MED ORDER — SCOPOLAMINE 1 MG/3DAYS TD PT72
1.0000 | MEDICATED_PATCH | Freq: Once | TRANSDERMAL | Status: DC
  Administered 2016-06-06: 1.5 mg via TRANSDERMAL

## 2016-06-06 MED ORDER — OXYCODONE HCL 5 MG PO TABS
5.0000 mg | ORAL_TABLET | Freq: Once | ORAL | Status: AC | PRN
  Administered 2016-06-06: 5 mg via ORAL

## 2016-06-06 MED ORDER — LIDOCAINE HCL (CARDIAC) 20 MG/ML IV SOLN
INTRAVENOUS | Status: AC
  Filled 2016-06-06: qty 5

## 2016-06-06 MED ORDER — KETOROLAC TROMETHAMINE 30 MG/ML IJ SOLN
INTRAMUSCULAR | Status: AC
  Filled 2016-06-06: qty 1

## 2016-06-06 MED ORDER — IBUPROFEN 800 MG PO TABS: 600.0000 mg | ORAL_TABLET | Freq: Three times a day (TID) | ORAL | 2 refills | Status: AC | PRN

## 2016-06-06 MED ORDER — SUGAMMADEX SODIUM 200 MG/2ML IV SOLN
INTRAVENOUS | Status: DC | PRN
  Administered 2016-06-06: 142.8 mg via INTRAVENOUS

## 2016-06-06 MED ORDER — OXYCODONE HCL 5 MG PO TABS
ORAL_TABLET | ORAL | Status: DC
Start: 2016-06-06 — End: 2016-06-06
  Filled 2016-06-06: qty 1

## 2016-06-06 MED ORDER — 0.9 % SODIUM CHLORIDE (POUR BTL) OPTIME
TOPICAL | Status: DC | PRN
  Administered 2016-06-06: 1000 mL

## 2016-06-06 MED ORDER — MEPERIDINE HCL 25 MG/ML IJ SOLN: 6.2500 mg | INTRAMUSCULAR | Status: DC | PRN

## 2016-06-06 MED ORDER — DEXAMETHASONE SODIUM PHOSPHATE 10 MG/ML IJ SOLN
INTRAMUSCULAR | Status: DC | PRN
  Administered 2016-06-06: 10 mg via INTRAVENOUS

## 2016-06-06 MED ORDER — GLYCOPYRROLATE 0.2 MG/ML IJ SOLN
INTRAMUSCULAR | Status: AC
  Filled 2016-06-06: qty 1

## 2016-06-06 MED ORDER — FENTANYL CITRATE (PF) 100 MCG/2ML IJ SOLN
25.0000 ug | INTRAMUSCULAR | Status: DC | PRN
  Administered 2016-06-06: 50 ug via INTRAVENOUS

## 2016-06-06 SURGICAL SUPPLY — 27 items
ADH SKN CLS APL DERMABOND .7 (GAUZE/BANDAGES/DRESSINGS) ×1
CATH ROBINSON RED A/P 16FR (CATHETERS) ×3 IMPLANT
CLIP FILSHIE TUBAL LIGA STRL (Clip) ×2 IMPLANT
CLOTH BEACON ORANGE TIMEOUT ST (SAFETY) ×3 IMPLANT
DERMABOND ADVANCED (GAUZE/BANDAGES/DRESSINGS) ×2
DERMABOND ADVANCED .7 DNX12 (GAUZE/BANDAGES/DRESSINGS) IMPLANT
DRSG OPSITE POSTOP 3X4 (GAUZE/BANDAGES/DRESSINGS) ×3 IMPLANT
DURAPREP 26ML APPLICATOR (WOUND CARE) ×3 IMPLANT
GLOVE BIO SURGEON STRL SZ 6.5 (GLOVE) ×2 IMPLANT
GLOVE BIO SURGEONS STRL SZ 6.5 (GLOVE) ×1
GLOVE BIOGEL PI IND STRL 7.0 (GLOVE) ×4 IMPLANT
GLOVE BIOGEL PI INDICATOR 7.0 (GLOVE) ×8
GOWN STRL REUS W/TWL LRG LVL3 (GOWN DISPOSABLE) ×6 IMPLANT
NEEDLE INSUFFLATION 120MM (ENDOMECHANICALS) IMPLANT
PACK LAPAROSCOPY BASIN (CUSTOM PROCEDURE TRAY) ×3 IMPLANT
PACK TRENDGUARD 450 HYBRID PRO (MISCELLANEOUS) IMPLANT
PACK TRENDGUARD 600 HYBRD PROC (MISCELLANEOUS) IMPLANT
PROTECTOR NERVE ULNAR (MISCELLANEOUS) ×6 IMPLANT
SLEEVE XCEL OPT CAN 5 100 (ENDOMECHANICALS) IMPLANT
SUT MON AB 4-0 PS1 27 (SUTURE) ×2 IMPLANT
SUT VICRYL 0 UR6 27IN ABS (SUTURE) ×3 IMPLANT
TOWEL OR 17X24 6PK STRL BLUE (TOWEL DISPOSABLE) ×6 IMPLANT
TRENDGUARD 450 HYBRID PRO PACK (MISCELLANEOUS) ×3
TRENDGUARD 600 HYBRID PROC PK (MISCELLANEOUS)
TROCAR XCEL NON-BLD 11X100MML (ENDOMECHANICALS) ×3 IMPLANT
TROCAR XCEL NON-BLD 5MMX100MML (ENDOMECHANICALS) ×3 IMPLANT
WARMER LAPAROSCOPE (MISCELLANEOUS) ×3 IMPLANT

## 2016-06-06 NOTE — Anesthesia Postprocedure Evaluation (Addendum)
Anesthesia Post Note  Patient: Donna [name]  Procedure(s) Performed: Procedure(s) (LRB): LAPAROSCOPIC TUBAL LIGATION (Bilateral)  Patient location during evaluation: PACU Anesthesia Type: General Level of consciousness: awake and alert Pain management: pain level controlled Vital Signs Assessment: post-procedure vital signs reviewed and stable Respiratory status: spontaneous breathing, nonlabored ventilation, respiratory function stable and patient connected to nasal cannula oxygen Cardiovascular status: blood pressure returned to baseline and stable Postop Assessment: no signs of nausea or vomiting Anesthetic complications: no        Last Vitals:  Vitals:   06/06/16 1035 06/06/16 1119  BP:  116/75  Pulse: 88 74  Resp: (!) 25 20  Temp: 36.7 C     Last Pain:  Vitals:   06/06/16 1035  TempSrc: Oral  PainSc:    Pain Goal: Patients Stated Pain Goal: 4 (06/06/16 0617)               Shelton Silvas

## 2016-06-06 NOTE — H&P (Signed)
Donna [name] is an 26 y.o. female P3 with desired sterilization  Pertinent Gynecological History: Menses: flow is moderate Bleeding: dysfunctional uterine bleeding Contraception: none DES exposure: denies Blood transfusions: none Sexually transmitted diseases: no past history Previous GYN Procedures: none  Last mammogram: n/a Last pap: normal Date: 2018 OB History: G2, P3   Menstrual History: Menarche age: 57 No LMP recorded.    Past Medical History:  Diagnosis Date  . Genital HSV   . SVD (spontaneous vaginal delivery)    x 3 (1 set of twins)    Past Surgical History:  Procedure Laterality Date  . BREAST ENHANCEMENT SURGERY    . BREAST SURGERY     Augmentation 2010  . WISDOM TOOTH EXTRACTION      Family History  Problem Relation Age of Onset  . Hypertension Mother   . Hypertension Maternal Grandfather     Social History:  reports that she has never smoked. She has never used smokeless tobacco. She reports that she drinks about 3.0 oz of alcohol per week . She reports that she does not use drugs.  Allergies:  Allergies  Allergen Reactions  . Amoxicillin Hives and Rash    Has patient had a PCN reaction causing immediate rash, facial/tongue/throat swelling, SOB or lightheadedness with hypotension: No Has patient had a PCN reaction causing severe rash involving mucus membranes or skin necrosis: No Has patient had a PCN reaction that required hospitalization No Has patient had a PCN reaction occurring within the last 10 years: No If all of the above answers are "NO", then may proceed with Cephalosporin use.   . Clindamycin/Lincomycin Rash    Prescriptions Prior to Admission  Medication Sig Dispense Refill Last Dose  . Prenatal Vit-Fe Fumarate-FA (PRENATAL MULTIVITAMIN) TABS Take 1 tablet by mouth daily at 12 noon.   06/05/2016 at Unknown time  . sertraline (ZOLOFT) 50 MG tablet Take 50 mg by mouth at bedtime.    06/05/2016 at Unknown time  . valACYclovir  (VALTREX) 500 MG tablet Take 500 mg by mouth at bedtime.    06/05/2016 at Unknown time  . calcium carbonate (TUMS - DOSED IN MG ELEMENTAL CALCIUM) 500 MG chewable tablet Chew 1 tablet by mouth as needed for indigestion or heartburn (Patient takes up to 6 tablets daily.).   Unknown at Unknown time  . ibuprofen (ADVIL,MOTRIN) 600 MG tablet Take 1 tablet (600 mg total) by mouth every 6 (six) hours. (Patient not taking: Reported on 06/05/2016) 40 tablet 0 Not Taking at Unknown time    ROS  Blood pressure 111/80, pulse 73, temperature 98.1 F (36.7 C), temperature source Oral, resp. rate 18, SpO2 98 %, unknown if currently breastfeeding. Physical Exam  Results for orders placed or performed during the hospital encounter of 06/06/16 (from the past 24 hour(s))  Pregnancy, urine     Status: None   Collection Time: 06/06/16  6:00 AM  Result Value Ref Range   Preg Test, Ur NEGATIVE NEGATIVE    No results found.  Assessment/Plan: Patient with desired sterilization We discussed the risks of a laparoscopic BTL to include injury, bleeding, infection,  Inability to complete laparoscopically and most of all regret Patient voices understanding and agrees to proceed    Donna [name] 06/06/2016, 7:58 AM

## 2016-06-06 NOTE — Discharge Instructions (Signed)

## 2016-06-06 NOTE — Transfer of Care (Signed)
Immediate Anesthesia Transfer of Care Note  Patient: Donna [name]  Procedure(s) Performed: Procedure(s) with comments: LAPAROSCOPIC TUBAL LIGATION (Bilateral) - WITH FILSHIE CLIPS  Patient Location: PACU  Anesthesia Type:General  Level of Consciousness: awake, alert  and oriented  Airway & Oxygen Therapy: Patient Spontanous Breathing and Patient connected to nasal cannula oxygen  Post-op Assessment: Report given to RN and Post -op Vital signs reviewed and stable  Post vital signs: Reviewed and stable  Last Vitals:  Vitals:   06/06/16 0617  BP: 111/80  Pulse: 73  Resp: 18  Temp: 36.7 C    Last Pain:  Vitals:   06/06/16 0617  TempSrc: Oral      Patients Stated Pain Goal: 4 (06/06/16 0617)  Complications: No apparent anesthesia complications

## 2016-06-06 NOTE — Op Note (Signed)
OPERATIVE NOTE  EVALIN SHAWHAN  DOB:    1990/04/08  MRN:    [id number]  CSN:    [id number]  Date of Surgery:  06/06/2016  Preoperative Diagnosis: Desired permanent sterilization  Postoperative Diagnosis: Same as above   Procedure: 1. Diagnostic laparoscopy 2. Laparoscopic Bilateral tubal ligation  Surgeon:  Delila Spence. Mora Appl, M.D.  Assistant: None  Anesthetic: Local, General ETA    Fluids: 1500 mL UOP: 75 mL  Catheter: N/A EBL: 5 mL Complications: None  Indication:  Ms. Rosana Berger is a 26 y.o. year old female,who desires permanent sterilization. Risks, benefits, alternatives and indication was explained to patient prior to the procedure.   Findings:  Exam under anesthesia: Normal uterus, mobile anteverted, no palpable masses, Laparoscopy: Normal ovaries, tubes and ovaries bilaterally, normal liver edge   After adequate general endotracheal anesthesia was achieved, the patient was prepped and draped in the usual sterile fashion.  A graves speculum was placed in the vagina, the anterior lip of the cervix was held with a single tooth tenaculum. The Hulka uterine manipulator was then attached. Surgeon's gloves were changed and attention was turned to the abdomen.  7mL of 0.25 Marcaine was injected along the umbilicus. A 2 cm incision was made just below the umbilicus with an 11 blade scalpel and the abdominal wall tented up.  The 12mm Excel Visiport was used to enter the abdomen under direct visualization. Entry was confirmed and the abdomen was insufflated with approximately 3L CO2 gas. The operative scope was introduced and the above findings noted.  The filchie clip instrument was introduced and the tubes were followed to their fimbriated ends bilaterally.  A filchie clip was placed on the isthmic portion of each tube and confirmed visually to be around the entire circumference of each tube.  0.25% Marcaine was drizzled over each tube. After a quick inspection of the pelvis and  liver edge, all instruments were removed and the abdomen was desufflated.  The 12 mm fascial incision was closed with a figure of eight stitch of 2-vicryl and the skin was closed with 3-0 Monocryl and dermabond.  All instruments were removed from the vagina, the tenaculum sites on the anterior cervix were made hemostatic with silver nitrate and the patient returned to the PACU in stable condition.  Bereket Gernert [name]

## 2016-06-06 NOTE — Anesthesia Procedure Notes (Signed)
Procedure Name: Intubation Date/Time: 06/06/2016 8:11 AM Performed by: Hewitt Blade Pre-anesthesia Checklist: Patient identified, Emergency Drugs available, Suction available and Patient being monitored Patient Re-evaluated:Patient Re-evaluated prior to inductionOxygen Delivery Method: Circle system utilized Preoxygenation: Pre-oxygenation with 100% oxygen Intubation Type: IV induction Ventilation: Mask ventilation without difficulty Laryngoscope Size: Mac and 3 Grade View: Grade I Tube type: Oral Tube size: 7.0 mm Number of attempts: 1 Airway Equipment and Method: Stylet Placement Confirmation: ETT inserted through vocal cords under direct vision,  positive ETCO2 and breath sounds checked- equal and bilateral Secured at: 21 cm Tube secured with: Tape Dental Injury: Teeth and Oropharynx as per pre-operative assessment

## 2016-07-11 NOTE — Addendum Note (Signed)
Addendum  created 07/11/16 1246 by Marguetta Windish D, MD   Sign clinical note    

## 2017-12-02 ENCOUNTER — Emergency Department (HOSPITAL_BASED_OUTPATIENT_CLINIC_OR_DEPARTMENT_OTHER)
Admission: EM | Admit: 2017-12-02 | Discharge: 2017-12-02 | Disposition: A | Discharge status: Home / Self Care | Payer: BLUE CROSS/BLUE SHIELD | Attending: Emergency Medicine | Admitting: Emergency Medicine

## 2017-12-02 ENCOUNTER — Other Ambulatory Visit: Payer: Self-pay

## 2017-12-02 ENCOUNTER — Encounter (HOSPITAL_BASED_OUTPATIENT_CLINIC_OR_DEPARTMENT_OTHER): Payer: Self-pay | Admitting: Emergency Medicine

## 2017-12-02 DIAGNOSIS — Z79899 Other long term (current) drug therapy: Secondary | ICD-10-CM | POA: Insufficient documentation

## 2017-12-02 DIAGNOSIS — J020 Streptococcal pharyngitis: Secondary | ICD-10-CM | POA: Insufficient documentation

## 2017-12-02 LAB — GROUP A STREP BY PCR: Group A Strep by PCR: DETECTED — AB

## 2017-12-02 MED ORDER — AZITHROMYCIN 250 MG PO TABS: 250.0000 mg | ORAL_TABLET | Freq: Every day | ORAL | 0 refills | Status: DC

## 2017-12-02 NOTE — ED Provider Notes (Signed)
[facility] EMERGENCY DEPARTMENT Provider Note   CSN: [id number] Arrival date & time: 12/02/17  1039     History   Chief Complaint Chief Complaint  Patient presents with  . Sore Throat    HPI Donna [name] is a 27 y.o. female who presents to the ED with complaints of sore throat that started this AM when she woke up. Patient reports she has had some mild congestion and productive cough with mucous sputum for past 1-2 days. Woke up this AM with sore throat which is her chief complaint, pain is a 5/10 in severity, worse with swallowing but she is able to swallow, no alleviating factors, no meds PTA. Denies fever, chills, nausea, vomiting, voice change, dyspnea, or drooling. No sick contacts with similar.   HPI  Past Medical History:  Diagnosis Date  . Genital HSV   . SVD (spontaneous vaginal delivery)    x 3 (1 set of twins)    Patient Active Problem List   Diagnosis Date Noted  . Twin gestation, dichorionic diamniotic 04/01/2016  . Term pregnancy 03/20/2014  . Status post normal vaginal delivery 03/20/2014    Past Surgical History:  Procedure Laterality Date  . BREAST ENHANCEMENT SURGERY    . BREAST SURGERY     Augmentation 2010  . LAPAROSCOPIC TUBAL LIGATION Bilateral 06/06/2016   Procedure: LAPAROSCOPIC TUBAL LIGATION;  Surgeon: Essie Hart, MD;  Location: [facility];  Service: Gynecology;  Laterality: Bilateral;  WITH FILSHIE CLIPS  . WISDOM TOOTH EXTRACTION       OB History    Gravida  4   Para  3   Term  3   Preterm  0   AB  1   Living  4     SAB  1   TAB  0   Ectopic  0   Multiple  1   Live Births  4            Home Medications    Prior to Admission medications   Medication Sig Start Date End Date Taking? Authorizing Provider  calcium carbonate (TUMS - DOSED IN MG ELEMENTAL CALCIUM) 500 MG chewable tablet Chew 1 tablet by mouth as needed for indigestion or heartburn (Patient takes up to 6 tablets daily.).    [provider]  HYDROcodone-acetaminophen (NORCO/VICODIN) 5-325 MG tablet Take 1-2 tablets by mouth every 4 (four) hours as needed for moderate pain or severe pain. 06/06/16   Essie Hart, MD  ibuprofen (ADVIL,MOTRIN) 800 MG tablet Take 1 tablet (800 mg total) by mouth every 8 (eight) hours as needed for cramping. 06/06/16   Essie Hart, MD  norethindrone-ethinyl estradiol 1/35 (ORTHO-NOVUM 1/35, 28,) tablet Take 1 tablet by mouth daily. 06/06/16   Essie Hart, MD  Prenatal Vit-Fe Fumarate-FA (PRENATAL MULTIVITAMIN) TABS Take 1 tablet by mouth daily at 12 noon.    [provider]  sertraline (ZOLOFT) 50 MG tablet Take 50 mg by mouth at bedtime.     [provider]  valACYclovir (VALTREX) 500 MG tablet Take 500 mg by mouth at bedtime.     [provider]    Family History Family History  Problem Relation Age of Onset  . Hypertension Mother   . Hypertension Maternal Grandfather     Social History Social History   Tobacco Use  . Smoking status: Never Smoker  . Smokeless tobacco: Never Used  Substance Use Topics  . Alcohol use: Yes    Alcohol/week: 6.0 standard drinks  Types: 6 Standard drinks or equivalent per week    Comment: occasional  . Drug use: No     Allergies   Amoxicillin and Clindamycin/lincomycin  Review of Systems Review of Systems  Constitutional: Negative for chills and fever.  HENT: Positive for congestion, sore throat and trouble swallowing (painful, but able). Negative for drooling, ear pain and voice change.   Respiratory: Positive for cough. Negative for shortness of breath.   Cardiovascular: Negative for chest pain.  Gastrointestinal: Negative for abdominal pain, nausea and vomiting.    Physical Exam Updated Vital Signs BP 120/86 (BP Location: Right Arm)   Pulse 85   Temp 98.7 F (37.1 C) (Oral)   Resp 18   Ht 5\' 5"  (1.651 m)   Wt 79.4 kg   LMP 11/02/2017   SpO2 100%   BMI 29.12 kg/m   Physical Exam  Constitutional:  She appears well-developed and well-nourished. No distress.  HENT:  Head: Normocephalic and atraumatic.  Right Ear: Tympanic membrane is not perforated, not erythematous, not retracted and not bulging.  Left Ear: Tympanic membrane is not perforated, not erythematous, not retracted and not bulging.  Nose: Mucosal edema present.  Mouth/Throat: Uvula is midline. Oropharyngeal exudate and posterior oropharyngeal erythema present. Tonsils are 2+ on the right. Tonsils are 2+ on the left.  Posterior oropharynx is symmetric appearing. Patient tolerating own secretions without difficulty. No trismus. No drooling. No hot potato voice. No swelling beneath the tongue, submandibular compartment is soft.   Eyes: Pupils are equal, round, and reactive to light. Conjunctivae are normal. Right eye exhibits no discharge. Left eye exhibits no discharge.  Neck: Normal range of motion. Neck supple. No neck rigidity. No erythema present.  Cardiovascular: Normal rate and regular rhythm.  No murmur heard. Pulmonary/Chest: Breath sounds normal. No respiratory distress. She has no wheezes. She has no rales.  Abdominal: Soft. She exhibits no distension. There is no tenderness.  Lymphadenopathy:    She has cervical adenopathy (bilateral anterior).  Neurological: She is alert.  Skin: Skin is warm and dry. No rash noted.  Psychiatric: She has a normal mood and affect. Her behavior is normal.  Nursing note and vitals reviewed.   ED Treatments / Results  Labs (all labs ordered are listed, but only abnormal results are displayed) Labs Reviewed  GROUP A STREP BY PCR - Abnormal; Notable for the following components:      Result Value   Group A Strep by PCR DETECTED (*)    All other components within normal limits    EKG None  Radiology No results found.  Procedures Procedures (including critical care time)  Medications Ordered in ED Medications - No data to display   Initial Impression / Assessment and Plan  / ED Course  I have reviewed the triage vital signs and the nursing notes.  Pertinent labs & imaging results that were available during my care of the patient were reviewed by me and considered in my medical decision making (see chart for details).    Presents with complaint of sore throat.  Patient is nontoxic-appearing, vitals are within normal limits.  On exam patient with tonsillar erythema, exudates, and anterior cervical lymphadenopathy.  Strep test is positive. Exam non concerning for PTA or RPA, there is no trismus, uvular deviation, or hot potato voice. Patient is tolerating her own secretions without difficulty, no hot potato voice, full ROM of the neck, submandibular compartment is soft. Will treat with Azithromycin given patient's allergies to PCN/clindamycin. Recommended use of  Tylenol and Ibuprofen for any continued discomfort or fevers. I discussed results, treatment plan, need for PCP follow-up, and return precautions with the patient. Provided opportunity for questions, patient confirmed understanding and is in agreement with plan.    Final Clinical Impressions(s) / ED Diagnoses   Final diagnoses:  Strep throat    ED Discharge Orders         Ordered    azithromycin (ZITHROMAX) 250 MG tablet  Daily     12/02/17 1154           Jarvin Ogren, [place], PA-C 12/02/17 1155    Blane Ohara, MD 12/02/17 1545

## 2017-12-02 NOTE — ED Notes (Signed)
Pt states she has a stuffy and runny nose with throat pain.

## 2017-12-02 NOTE — Discharge Instructions (Signed)
You were seen in the emergency department and diagnosed with strep throat.  We are sending you home with a prescription of Azithromycin, an antibiotic, to treat this infection. Please take all of your antibiotics until finished. You may develop abdominal discomfort or diarrhea from the antibiotic.  You may help offset this with probiotics which you can buy at the store (ask your pharmacist if unable to find) or get probiotics in the form of eating yogurt. Do not eat or take the probiotics until 2 hours after your antibiotic. If you are unable to tolerate these side effects follow-up with your primary care provider or return to the emergency department.   If you begin to experience any blistering, rashes, swelling, or difficulty breathing seek medical care for evaluation of potentially more serious side effects.   Please be aware that this medication may interact with other medications you are taking, please be sure to discuss your medication list with your pharmacist. If you are taking birth control the antibiotic will deactivate your birth control for 2 weeks. If on coumadin the antibiotic will effect your coumadin level.   You should gradually feel better over the next few days. Take Tylenol and Ibuprofen for fever and/or pain per over the counter dosing. Follow up with your primary care provider in the next 1 week if you are not feeling better, if you do not have a primary care provider one is provided in your discharge instructions. Return to the emergency department for any new or worsening symptoms including but not limited to inability to open your mouth, inability to move your neck, worsening pain, change in your voice, inability to swallow your own saliva, drooling, or any other concerns.

## 2017-12-02 NOTE — ED Triage Notes (Signed)
Pt reports waking up with a sore throat today.

## 2018-08-27 ENCOUNTER — Encounter (HOSPITAL_BASED_OUTPATIENT_CLINIC_OR_DEPARTMENT_OTHER): Payer: Self-pay

## 2018-08-27 ENCOUNTER — Other Ambulatory Visit: Payer: Self-pay

## 2018-08-27 ENCOUNTER — Emergency Department (HOSPITAL_BASED_OUTPATIENT_CLINIC_OR_DEPARTMENT_OTHER)
Admission: EM | Admit: 2018-08-27 | Discharge: 2018-08-27 | Disposition: A | Discharge status: Home / Self Care | Payer: Self-pay | Attending: Emergency Medicine | Admitting: Emergency Medicine

## 2018-08-27 DIAGNOSIS — Z20828 Contact with and (suspected) exposure to other viral communicable diseases: Secondary | ICD-10-CM | POA: Insufficient documentation

## 2018-08-27 DIAGNOSIS — Z79899 Other long term (current) drug therapy: Secondary | ICD-10-CM | POA: Insufficient documentation

## 2018-08-27 DIAGNOSIS — R197 Diarrhea, unspecified: Secondary | ICD-10-CM

## 2018-08-27 LAB — COMPREHENSIVE METABOLIC PANEL
ALT: 65 U/L — ABNORMAL HIGH (ref 0–44)
AST: 45 U/L — ABNORMAL HIGH (ref 15–41)
Albumin: 4 g/dL (ref 3.5–5.0)
Alkaline Phosphatase: 72 U/L (ref 38–126)
Anion gap: 9 (ref 5–15)
BUN: 12 mg/dL (ref 6–20)
CO2: 19 mmol/L — ABNORMAL LOW (ref 22–32)
Calcium: 8.7 mg/dL — ABNORMAL LOW (ref 8.9–10.3)
Chloride: 110 mmol/L (ref 98–111)
Creatinine, Ser: 0.86 mg/dL (ref 0.44–1.00)
GFR calc Af Amer: >60 mL/min (ref >60)
GFR calc non Af Amer: >60 mL/min (ref >60)
Glucose, Bld: 93 mg/dL (ref 70–99)
Potassium: 3.3 mmol/L — ABNORMAL LOW (ref 3.5–5.1)
Sodium: 138 mmol/L (ref 135–145)
Total Bilirubin: 0.7 mg/dL (ref 0.3–1.2)
Total Protein: 7.1 g/dL (ref 6.5–8.1)

## 2018-08-27 LAB — CBC WITH DIFFERENTIAL/PLATELET
Abs Immature Granulocytes: 0.02 10*3/uL (ref 0.00–0.07)
Basophils Absolute: 0 10*3/uL (ref 0.0–0.1)
Basophils Relative: 0 %
Eosinophils Absolute: 0.2 10*3/uL (ref 0.0–0.5)
Eosinophils Relative: 2 %
HCT: 45 % (ref 36.0–46.0)
Hemoglobin: 15.3 g/dL — ABNORMAL HIGH (ref 12.0–15.0)
Immature Granulocytes: 0 %
Lymphocytes Relative: 21 %
Lymphs Abs: 1.6 10*3/uL (ref 0.7–4.0)
MCH: 32.6 pg (ref 26.0–34.0)
MCHC: 34 g/dL (ref 30.0–36.0)
MCV: 95.9 fL (ref 80.0–100.0)
Monocytes Absolute: 0.7 10*3/uL (ref 0.1–1.0)
Monocytes Relative: 9 %
Neutro Abs: 4.9 10*3/uL (ref 1.7–7.7)
Neutrophils Relative %: 68 %
Platelets: 148 10*3/uL — ABNORMAL LOW (ref 150–400)
RBC: 4.69 MIL/uL (ref 3.87–5.11)
RDW: 11.8 % (ref 11.5–15.5)
WBC: 7.4 10*3/uL (ref 4.0–10.5)
nRBC: 0 % (ref 0.0–0.2)

## 2018-08-27 LAB — PREGNANCY, URINE: Preg Test, Ur: NEGATIVE

## 2018-08-27 MED ORDER — SODIUM CHLORIDE 0.9 % IV BOLUS
1000.0000 mL | Freq: Once | INTRAVENOUS | Status: AC
  Administered 2018-08-27: 1000 mL via INTRAVENOUS

## 2018-08-27 NOTE — ED Triage Notes (Signed)
Pt c/o abd pain, diarrhea x 5 das-NAD-steady gait

## 2018-08-27 NOTE — ED Notes (Signed)
ED Provider at bedside. 

## 2018-08-28 ENCOUNTER — Telehealth (HOSPITAL_BASED_OUTPATIENT_CLINIC_OR_DEPARTMENT_OTHER): Payer: Self-pay | Admitting: Emergency Medicine

## 2018-08-28 LAB — GASTROINTESTINAL PANEL BY PCR, STOOL (REPLACES STOOL CULTURE)
Adenovirus F40/41: NOT DETECTED
Astrovirus: NOT DETECTED
Campylobacter species: NOT DETECTED
Cryptosporidium: DETECTED — AB
Cyclospora cayetanensis: NOT DETECTED
Entamoeba histolytica: NOT DETECTED
Enteroaggregative E coli (EAEC): NOT DETECTED
Enteropathogenic E coli (EPEC): DETECTED — AB
Enterotoxigenic E coli (ETEC): NOT DETECTED
Giardia lamblia: NOT DETECTED
Norovirus GI/GII: NOT DETECTED
Plesimonas shigelloides: NOT DETECTED
Rotavirus A: NOT DETECTED
Salmonella species: NOT DETECTED
Sapovirus (I, II, IV, and V): NOT DETECTED
Shiga like toxin producing E coli (STEC): NOT DETECTED
Shigella/Enteroinvasive E coli (EIEC): NOT DETECTED
Vibrio cholerae: NOT DETECTED
Vibrio species: NOT DETECTED
Yersinia enterocolitica: NOT DETECTED

## 2018-08-28 LAB — C DIFFICILE QUICK SCREEN W PCR REFLEX
C Diff antigen: NEGATIVE
C Diff interpretation: NOT DETECTED
C Diff toxin: NEGATIVE

## 2018-08-29 ENCOUNTER — Telehealth (HOSPITAL_BASED_OUTPATIENT_CLINIC_OR_DEPARTMENT_OTHER): Payer: Self-pay | Admitting: Emergency Medicine

## 2018-08-29 NOTE — ED Provider Notes (Signed)
[facility] EMERGENCY DEPARTMENT Provider Note   CSN: [id number] Arrival date & time: 08/27/18  [id number]     History   Chief Complaint Chief Complaint  Patient presents with  . Abdominal Pain    HPI Donna [name] is a 28 y.o. female.     HPI Patient is a healthy [age] year old female who reports presents the emergency department with diffuse watery diarrhea of the past 5 days.  Reports some crampy abdominal pain.  Denies nausea vomiting.  No recent travel outside the country.  No recent antibiotics.  No recent exposure to anyone else with diarrhea.  She is felt slightly weak.  She feels dehydrated.  She has had chills without documented fever.   Past Medical History:  Diagnosis Date  . Genital HSV   . SVD (spontaneous vaginal delivery)    x 3 (1 set of twins)    Patient Active Problem List   Diagnosis Date Noted  . Twin gestation, dichorionic diamniotic 04/01/2016  . Term pregnancy 03/20/2014  . Status post normal vaginal delivery 03/20/2014    Past Surgical History:  Procedure Laterality Date  . BREAST ENHANCEMENT SURGERY    . BREAST SURGERY     Augmentation 2010  . LAPAROSCOPIC TUBAL LIGATION Bilateral 06/06/2016   Procedure: LAPAROSCOPIC TUBAL LIGATION;  Surgeon: Essie HartWalda Pinn, MD;  Location: [facility];  Service: Gynecology;  Laterality: Bilateral;  WITH FILSHIE CLIPS  . WISDOM TOOTH EXTRACTION       OB History    Gravida  4   Para  3   Term  3   Preterm  0   AB  1   Living  4     SAB  1   TAB  0   Ectopic  0   Multiple  1   Live Births  4            Home Medications    Prior to Admission medications   Medication Sig Start Date End Date Taking? Authorizing Provider  azithromycin (ZITHROMAX) 250 MG tablet Take 1 tablet (250 mg total) by mouth daily. Take first 2 tablets together, then 1 every day until finished. 12/02/17   Petrucelli, Samantha R, PA-C  calcium carbonate (TUMS - DOSED IN MG ELEMENTAL CALCIUM) 500 MG chewable tablet  Chew 1 tablet by mouth as needed for indigestion or heartburn (Patient takes up to 6 tablets daily.).    [provider]  HYDROcodone-acetaminophen (NORCO/VICODIN) 5-325 MG tablet Take 1-2 tablets by mouth every 4 (four) hours as needed for moderate pain or severe pain. 06/06/16   Essie HartPinn, Walda, MD  ibuprofen (ADVIL,MOTRIN) 800 MG tablet Take 1 tablet (800 mg total) by mouth every 8 (eight) hours as needed for cramping. 06/06/16   Essie HartPinn, Walda, MD  norethindrone-ethinyl estradiol 1/35 (ORTHO-NOVUM 1/35, 28,) tablet Take 1 tablet by mouth daily. 06/06/16   Essie HartPinn, Walda, MD  Prenatal Vit-Fe Fumarate-FA (PRENATAL MULTIVITAMIN) TABS Take 1 tablet by mouth daily at 12 noon.    [provider]  sertraline (ZOLOFT) 50 MG tablet Take 50 mg by mouth at bedtime.     [provider]  valACYclovir (VALTREX) 500 MG tablet Take 500 mg by mouth at bedtime.     [provider]    Family History Family History  Problem Relation Age of Onset  . Hypertension Mother   . Hypertension Maternal Grandfather     Social History Social History   Tobacco Use  . Smoking status: Never Smoker  . Smokeless  tobacco: Never Used  Substance Use Topics  . Alcohol use: Yes    Comment: occ  . Drug use: No     Allergies   Amoxicillin and Clindamycin/lincomycin   Review of Systems Review of Systems  All other systems reviewed and are negative.    Physical Exam Updated Vital Signs BP 111/80   Pulse 80   Temp 98.3 F (36.8 C) (Oral)   Resp 17   Ht 5\' 5"  (1.651 m)   Wt 74.5 kg   LMP 08/22/2018   SpO2 100%   BMI 27.33 kg/m   Physical Exam Vitals signs and nursing note reviewed.  Constitutional:      General: She is not in acute distress.    Appearance: She is well-developed.  HENT:     Head: Normocephalic and atraumatic.  Neck:     Musculoskeletal: Normal range of motion.  Cardiovascular:     Rate and Rhythm: Normal rate and regular rhythm.     Heart sounds: Normal  heart sounds.  Pulmonary:     Effort: Pulmonary effort is normal.     Breath sounds: Normal breath sounds.  Abdominal:     General: There is no distension.     Palpations: Abdomen is soft.     Tenderness: There is no abdominal tenderness.  Musculoskeletal: Normal range of motion.  Skin:    General: Skin is warm and dry.  Neurological:     Mental Status: She is alert and oriented to person, place, and time.  Psychiatric:        Judgment: Judgment normal.      ED Treatments / Results  Labs (all labs ordered are listed, but only abnormal results are displayed) Labs Reviewed                  All other components within normal limits  CBC WITH DIFFERENTIAL/PLATELET - Abnormal; Notable for the following components:   Hemoglobin 15.3 (*)    Platelets 148 (*)    All other components within normal limits  COMPREHENSIVE METABOLIC PANEL - Abnormal; Notable for the following components:   Potassium 3.3 (*)    CO2 19 (*)    Calcium 8.7 (*)    AST 45 (*)    ALT 65 (*)    All other components within normal limits  C DIFFICILE QUICK SCREEN W PCR REFLEX  NOVEL CORONAVIRUS, NAA (HOSPITAL ORDER, SEND-OUT TO REF LAB)  PREGNANCY, URINE    EKG None  Radiology No results found.  Procedures Procedures (including critical care time)  Medications Ordered in ED Medications  sodium chloride 0.9 % bolus 1,000 mL (0 mLs Intravenous Stopped 08/27/18 2053)     Initial Impression / Assessment and Plan / ED Course  I have reviewed the triage vital signs and the nursing notes.  Pertinent labs & imaging results that were available during my care of the patient were reviewed by me and considered in my medical decision making (see chart for details).        Hydrated in the emergency department.  Reassuring examination here.  Feeling much better.  Discharged home with instructions to follow-up with GI if diarrhea does not improve.  Patient will be evaluated for C. difficile.   Gastrointestinal panel will be sent as well.  Patient will also be checked for coronavirus.  No indication for additional treatment or work-up at this time.  Instructed in oral hydration at home  Final Clinical Impressions(s) / ED Diagnoses   Final diagnoses:  Diarrhea, unspecified type    ED Discharge Orders    None       Azalia Bilisampos, Pasquale Matters, MD 08/29/18 0005

## 2018-09-01 ENCOUNTER — Telehealth (HOSPITAL_COMMUNITY): Payer: Self-pay

## 2018-09-01 LAB — NOVEL CORONAVIRUS, NAA (HOSP ORDER, SEND-OUT TO REF LAB; TAT 18-24 HRS): SARS-CoV-2, NAA: NOT DETECTED

## 2018-09-10 ENCOUNTER — Other Ambulatory Visit: Payer: Self-pay

## 2018-09-10 ENCOUNTER — Encounter (HOSPITAL_COMMUNITY): Payer: Self-pay | Admitting: Emergency Medicine

## 2018-09-10 ENCOUNTER — Emergency Department (HOSPITAL_COMMUNITY)
Admission: EM | Admit: 2018-09-10 | Discharge: 2018-09-11 | Disposition: A | Discharge status: Home / Self Care | Payer: Self-pay | Attending: Emergency Medicine | Admitting: Emergency Medicine

## 2018-09-10 DIAGNOSIS — R1011 Right upper quadrant pain: Secondary | ICD-10-CM | POA: Insufficient documentation

## 2018-09-10 DIAGNOSIS — Z79899 Other long term (current) drug therapy: Secondary | ICD-10-CM | POA: Insufficient documentation

## 2018-09-10 DIAGNOSIS — N10 Acute tubulo-interstitial nephritis: Secondary | ICD-10-CM | POA: Insufficient documentation

## 2018-09-10 DIAGNOSIS — R109 Unspecified abdominal pain: Secondary | ICD-10-CM

## 2018-09-10 DIAGNOSIS — N12 Tubulo-interstitial nephritis, not specified as acute or chronic: Secondary | ICD-10-CM

## 2018-09-10 DIAGNOSIS — R1013 Epigastric pain: Secondary | ICD-10-CM | POA: Insufficient documentation

## 2018-09-10 NOTE — ED Triage Notes (Signed)
Pt lying in bed when sudden onset of right sided abdominal pain. Denies NV. Diarrhea stopped and started back tonight.  Diagnosed with parasite in fecal matter per patient approximately 2 weeks ago. Received no treatment.

## 2018-09-11 ENCOUNTER — Emergency Department (HOSPITAL_COMMUNITY): Payer: Self-pay

## 2018-09-11 ENCOUNTER — Other Ambulatory Visit: Payer: Self-pay

## 2018-09-11 ENCOUNTER — Telehealth (HOSPITAL_COMMUNITY): Payer: Self-pay | Admitting: Emergency Medicine

## 2018-09-11 ENCOUNTER — Emergency Department (HOSPITAL_COMMUNITY)
Admission: RE | Admit: 2018-09-11 | Discharge: 2018-09-11 | Disposition: A | Discharge status: Home / Self Care | Payer: Self-pay | Source: Ambulatory Visit | Attending: Emergency Medicine | Admitting: Emergency Medicine

## 2018-09-11 LAB — I-STAT BETA HCG BLOOD, ED (MC, WL, AP ONLY): I-stat hCG, quantitative: <5 m[IU]/mL (ref <5)

## 2018-09-11 LAB — URINALYSIS, ROUTINE W REFLEX MICROSCOPIC
Bilirubin Urine: NEGATIVE
Glucose, UA: NEGATIVE mg/dL
Hgb urine dipstick: NEGATIVE
Ketones, ur: 5 mg/dL — AB
Nitrite: POSITIVE — AB
Protein, ur: NEGATIVE mg/dL
Specific Gravity, Urine: 1.018 (ref 1.005–1.030)
pH: 7 (ref 5.0–8.0)

## 2018-09-11 LAB — CBC WITH DIFFERENTIAL/PLATELET
Abs Immature Granulocytes: 0.07 10*3/uL (ref 0.00–0.07)
Basophils Absolute: 0 10*3/uL (ref 0.0–0.1)
Basophils Relative: 0 %
Eosinophils Absolute: 0.1 10*3/uL (ref 0.0–0.5)
Eosinophils Relative: 1 %
HCT: 41.8 % (ref 36.0–46.0)
Hemoglobin: 14 g/dL (ref 12.0–15.0)
Immature Granulocytes: 1 %
Lymphocytes Relative: 8 %
Lymphs Abs: 1 10*3/uL (ref 0.7–4.0)
MCH: 32.3 pg (ref 26.0–34.0)
MCHC: 33.5 g/dL (ref 30.0–36.0)
MCV: 96.5 fL (ref 80.0–100.0)
Monocytes Absolute: 0.5 10*3/uL (ref 0.1–1.0)
Monocytes Relative: 4 %
Neutro Abs: 11.6 10*3/uL — ABNORMAL HIGH (ref 1.7–7.7)
Neutrophils Relative %: 86 %
Platelets: 179 10*3/uL (ref 150–400)
RBC: 4.33 MIL/uL (ref 3.87–5.11)
RDW: 11.9 % (ref 11.5–15.5)
WBC: 13.3 10*3/uL — ABNORMAL HIGH (ref 4.0–10.5)
nRBC: 0 % (ref 0.0–0.2)

## 2018-09-11 LAB — COMPREHENSIVE METABOLIC PANEL
ALT: 57 U/L — ABNORMAL HIGH (ref 0–44)
AST: 166 U/L — ABNORMAL HIGH (ref 15–41)
Albumin: 4.2 g/dL (ref 3.5–5.0)
Alkaline Phosphatase: 80 U/L (ref 38–126)
Anion gap: 8 (ref 5–15)
BUN: 12 mg/dL (ref 6–20)
CO2: 21 mmol/L — ABNORMAL LOW (ref 22–32)
Calcium: 8.8 mg/dL — ABNORMAL LOW (ref 8.9–10.3)
Chloride: 110 mmol/L (ref 98–111)
Creatinine, Ser: 0.76 mg/dL (ref 0.44–1.00)
GFR calc Af Amer: >60 mL/min (ref >60)
GFR calc non Af Amer: >60 mL/min (ref >60)
Glucose, Bld: 135 mg/dL — ABNORMAL HIGH (ref 70–99)
Potassium: 3.8 mmol/L (ref 3.5–5.1)
Sodium: 139 mmol/L (ref 135–145)
Total Bilirubin: 0.9 mg/dL (ref 0.3–1.2)
Total Protein: 7 g/dL (ref 6.5–8.1)

## 2018-09-11 LAB — LIPASE, BLOOD: Lipase: 53 U/L — ABNORMAL HIGH (ref 11–51)

## 2018-09-11 MED ORDER — PAROMOMYCIN SULFATE 250 MG PO CAPS: 500.0000 mg | ORAL_CAPSULE | Freq: Three times a day (TID) | ORAL | 0 refills | Status: AC

## 2018-09-11 MED ORDER — NITAZOXANIDE 500 MG PO TABS: 500.0000 mg | ORAL_TABLET | Freq: Two times a day (BID) | ORAL | 0 refills | Status: DC

## 2018-09-11 MED ORDER — CIPROFLOXACIN IN D5W 400 MG/200ML IV SOLN
400.0000 mg | Freq: Once | INTRAVENOUS | Status: AC
  Administered 2018-09-11: 03:00:00 400 mg via INTRAVENOUS
  Filled 2018-09-11: qty 200

## 2018-09-11 MED ORDER — SODIUM CHLORIDE 0.9 % IV BOLUS
1000.0000 mL | Freq: Once | INTRAVENOUS | Status: AC
  Administered 2018-09-11: 01:00:00 1000 mL via INTRAVENOUS

## 2018-09-11 MED ORDER — CIPROFLOXACIN HCL 500 MG PO TABS: 500.0000 mg | ORAL_TABLET | Freq: Two times a day (BID) | ORAL | 0 refills | Status: DC

## 2018-09-11 MED ORDER — ONDANSETRON HCL 4 MG PO TABS: 4.0000 mg | ORAL_TABLET | Freq: Three times a day (TID) | ORAL | 0 refills | Status: AC | PRN

## 2018-09-11 MED ORDER — DICYCLOMINE HCL 10 MG/ML IM SOLN
20.0000 mg | Freq: Once | INTRAMUSCULAR | Status: AC
  Administered 2018-09-11: 01:00:00 20 mg via INTRAMUSCULAR
  Filled 2018-09-11: qty 2

## 2018-09-11 MED ORDER — FENTANYL CITRATE (PF) 100 MCG/2ML IJ SOLN
50.0000 ug | Freq: Once | INTRAMUSCULAR | Status: AC
  Administered 2018-09-11: 04:00:00 50 ug via INTRAVENOUS
  Filled 2018-09-11: qty 2

## 2018-09-11 MED ORDER — ONDANSETRON HCL 4 MG/2ML IJ SOLN
4.0000 mg | Freq: Once | INTRAMUSCULAR | Status: AC
  Administered 2018-09-11: 01:00:00 4 mg via INTRAVENOUS
  Filled 2018-09-11: qty 2

## 2018-09-11 MED ORDER — KETOROLAC TROMETHAMINE 30 MG/ML IJ SOLN
30.0000 mg | Freq: Once | INTRAMUSCULAR | Status: AC
  Administered 2018-09-11: 01:00:00 30 mg via INTRAVENOUS
  Filled 2018-09-11: qty 1

## 2018-09-11 NOTE — ED Notes (Signed)
Patient transported to CT 

## 2018-09-11 NOTE — ED Notes (Signed)
Pt called requesting an alternative be called in for the Alinia that was prescribed by Dr. Tomi Bamberger.  Pt says it's too expensive without insurance.   Notified J. Idol PA

## 2018-09-11 NOTE — Discharge Instructions (Addendum)
Your urine and CT scan show that you have an infection in your bladder that is spreading up to your kidney.  Take the Cipro until gone.  Drink plenty of fluids so you do not get dehydrated.  You can take ibuprofen 600 mg every 6 hours for pain.  Use the Zofran for nausea if needed.  You can take Imodium t right ear over-the-counter if the diarrhea returns.  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  Please call radiology about 8:30 in the morning, [phone] to get an outpatient ultrasound of your gallbladder done this morning.  Do not eat or drink before that test.  If you have gallstones, look at the gallbladder information sheet.  If you have gallstones make an appointment with Dr. Constance Haw, general surgery to discuss surgery to remove your gallbladder.

## 2018-09-11 NOTE — ED Provider Notes (Signed)
[facility] EMERGENCY DEPARTMENT Provider Note   CSN: [id number] Arrival date & time: 09/10/18  2334  Time seen 12:08 AM  History   Chief Complaint Chief Complaint  Patient presents with   Abdominal Pain    right side    HPI Donna [name] is a 28 y.o. female.     HPI patient was seen in the ED on July 17 with a complaint of diffuse watery sometimes frothy diarrhea for 5 days.  She states the diarrhea continued until the 21st and then resolved.  She states she has been having some epigastric abdominal cramping intermittently but tonight about 10 PM she started having worsening pain in her right side that radiates into her right flank.  She had one episode of watery diarrhea tonight.  She has had nausea without vomiting or fever.  She denies any hematuria.  She states the pain is cramping and sharp and constant.  Nothing she does makes it feel worse, nothing she does makes it feel better.  She states tonight she ate [place] food but nobody else who ate the same has been ill.  She states when her symptoms started she was in [facility] but did not drink any stream or lake water, she may have eaten a hamburger and states she normally eats or hamburgers medium rare.  She states "I have eaten them that way all my life".  Patient denies any history of HIV.  She had stool samples done when she was seen on the 17th and her cultures grew out enteropathic E. coli and Cryptosporidium.  She denies any recent antibiotic use.  PCP Kathreen Cornfield, MD   Past Medical History:  Diagnosis Date   Genital HSV    SVD (spontaneous vaginal delivery)    x 3 (1 set of twins)    Patient Active Problem List   Diagnosis Date Noted   Twin gestation, dichorionic diamniotic 04/01/2016   Term pregnancy 03/20/2014   Status post normal vaginal delivery 03/20/2014    Past Surgical History:  Procedure Laterality Date   BREAST ENHANCEMENT SURGERY     BREAST SURGERY     Augmentation  2010   LAPAROSCOPIC TUBAL LIGATION Bilateral 06/06/2016   Procedure: LAPAROSCOPIC TUBAL LIGATION;  Surgeon: Sanjuana Kava, MD;  Location: [facility];  Service: Gynecology;  Laterality: Bilateral;  WITH FILSHIE CLIPS   WISDOM TOOTH EXTRACTION       OB History    Gravida  4   Para  3   Term  3   Preterm  0   AB  1   Living  4     SAB  1   TAB  0   Ectopic  0   Multiple  1   Live Births  4            Home Medications    Prior to Admission medications   Medication Sig Start Date End Date Taking? Authorizing Provider  azithromycin (ZITHROMAX) 250 MG tablet Take 1 tablet (250 mg total) by mouth daily. Take first 2 tablets together, then 1 every day until finished. 12/02/17   Petrucelli, Samantha R, PA-C  calcium carbonate (TUMS - DOSED IN MG ELEMENTAL CALCIUM) 500 MG chewable tablet Chew 1 tablet by mouth as needed for indigestion or heartburn (Patient takes up to 6 tablets daily.).    [provider]  ciprofloxacin (CIPRO) 500 MG tablet Take 1 tablet (500 mg total) by mouth 2 (two) times daily. 09/11/18   Tomi Bamberger,  Eriel Doyon, MD  HYDROcodone-acetaminophen (NORCO/VICODIN) 5-325 MG tablet Take 1-2 tablets by mouth every 4 (four) hours as needed for moderate pain or severe pain. 06/06/16   Essie HartPinn, Walda, MD  ibuprofen (ADVIL,MOTRIN) 800 MG tablet Take 1 tablet (800 mg total) by mouth every 8 (eight) hours as needed for cramping. 06/06/16   Essie HartPinn, Walda, MD  nitazoxanide (ALINIA) 500 MG tablet Take 1 tablet (500 mg total) by mouth 2 (two) times daily with a meal. 09/11/18   Devoria AlbeKnapp, Jaylaa Gallion, MD  norethindrone-ethinyl estradiol 1/35 (ORTHO-NOVUM 1/35, 28,) tablet Take 1 tablet by mouth daily. 06/06/16   Essie HartPinn, Walda, MD  ondansetron (ZOFRAN) 4 MG tablet Take 1 tablet (4 mg total) by mouth every 8 (eight) hours as needed for nausea or vomiting. 09/11/18   Devoria AlbeKnapp, Ruchel Brandenburger, MD  Prenatal Vit-Fe Fumarate-FA (PRENATAL MULTIVITAMIN) TABS Take 1 tablet by mouth daily at 12 noon.    [provider]    sertraline (ZOLOFT) 50 MG tablet Take 50 mg by mouth at bedtime.     [provider]  valACYclovir (VALTREX) 500 MG tablet Take 500 mg by mouth at bedtime.     [provider]    Family History Family History  Problem Relation Age of Onset   Hypertension Mother    Hypertension Maternal Grandfather     Social History Social History   Tobacco Use   Smoking status: Never Smoker   Smokeless tobacco: Never Used  Substance Use Topics   Alcohol use: Yes    Comment: occ   Drug use: No     Allergies   Amoxicillin and Clindamycin/lincomycin   Review of Systems Review of Systems  All other systems reviewed and are negative.    Physical Exam Updated Vital Signs BP 114/69 (BP Location: Right Arm)    Pulse 94    Temp 98.3 F (36.8 C) (Oral)    Resp 18    Ht 5\' 5"  (1.651 m)    Wt 73.5 kg    LMP 08/22/2018    SpO2 99%    BMI 26.96 kg/m   Vital signs normal    Physical Exam Vitals signs and nursing note reviewed.  Constitutional:      General: She is in acute distress.     Appearance: She is well-developed.     Comments: Patient presents via EMS screaming out loud  HENT:     Head: Normocephalic and atraumatic.     Right Ear: External ear normal.     Left Ear: External ear normal.     Nose: Nose normal.     Mouth/Throat:     Mouth: Mucous membranes are dry.     Pharynx: Oropharynx is clear.  Eyes:     Extraocular Movements: Extraocular movements intact.     Conjunctiva/sclera: Conjunctivae normal.     Pupils: Pupils are equal, round, and reactive to light.  Neck:     Musculoskeletal: Normal range of motion.  Cardiovascular:     Rate and Rhythm: Normal rate.     Pulses: Normal pulses.     Heart sounds: Normal heart sounds.  Pulmonary:     Effort: Pulmonary effort is normal. No respiratory distress.     Breath sounds: Normal breath sounds.  Abdominal:     General: Bowel sounds are increased.     Tenderness: There is abdominal tenderness in  the right upper quadrant and epigastric area.  Musculoskeletal: Normal range of motion.  Skin:    General: Skin is warm and dry.  Coloration: Skin is not pale.     Findings: No erythema.  Neurological:     General: No focal deficit present.     Mental Status: She is alert and oriented to person, place, and time.     Cranial Nerves: No cranial nerve deficit.  Psychiatric:        Mood and Affect: Mood normal.        Behavior: Behavior normal.        Thought Content: Thought content normal.      ED Treatments / Results  Labs (all labs ordered are listed, but only abnormal results are displayed)  Results for orders placed or performed during the hospital encounter of 09/10/18  Comprehensive metabolic panel  Result Value Ref Range   Sodium 139 135 - 145 mmol/L   Potassium 3.8 3.5 - 5.1 mmol/L   Chloride 110 98 - 111 mmol/L   CO2 21 (L) 22 - 32 mmol/L   Glucose, Bld 135 (H) 70 - 99 mg/dL   BUN 12 6 - 20 mg/dL   Creatinine, Ser 9.600.76 0.44 - 1.00 mg/dL   Calcium 8.8 (L) 8.9 - 10.3 mg/dL   Total Protein 7.0 6.5 - 8.1 g/dL   Albumin 4.2 3.5 - 5.0 g/dL   AST 454166 (H) 15 - 41 U/L   ALT 57 (H) 0 - 44 U/L   Alkaline Phosphatase 80 38 - 126 U/L   Total Bilirubin 0.9 0.3 - 1.2 mg/dL   GFR calc non Af Amer >60 >60 mL/min   GFR calc Af Amer >60 >60 mL/min   Anion gap 8 5 - 15  Lipase, blood  Result Value Ref Range   Lipase 53 (H) 11 - 51 U/L  CBC with Differential  Result Value Ref Range   WBC 13.3 (H) 4.0 - 10.5 K/uL   RBC 4.33 3.87 - 5.11 MIL/uL   Hemoglobin 14.0 12.0 - 15.0 g/dL   HCT 09.841.8 11.936.0 - 14.746.0 %   MCV 96.5 80.0 - 100.0 fL   MCH 32.3 26.0 - 34.0 pg   MCHC 33.5 30.0 - 36.0 g/dL   RDW 82.911.9 56.211.5 - 13.015.5 %   Platelets 179 150 - 400 K/uL   nRBC 0.0 0.0 - 0.2 %   Neutrophils Relative % 86 %   Neutro Abs 11.6 (H) 1.7 - 7.7 K/uL   Lymphocytes Relative 8 %   Lymphs Abs 1.0 0.7 - 4.0 K/uL   Monocytes Relative 4 %   Monocytes Absolute 0.5 0.1 - 1.0 K/uL   Eosinophils  Relative 1 %   Eosinophils Absolute 0.1 0.0 - 0.5 K/uL   Basophils Relative 0 %   Basophils Absolute 0.0 0.0 - 0.1 K/uL   Immature Granulocytes 1 %   Abs Immature Granulocytes 0.07 0.00 - 0.07 K/uL  Urinalysis, Routine w reflex microscopic  Result Value Ref Range   Color, Urine YELLOW YELLOW   APPearance HAZY (A) CLEAR   Specific Gravity, Urine 1.018 1.005 - 1.030   pH 7.0 5.0 - 8.0   Glucose, UA NEGATIVE NEGATIVE mg/dL   Hgb urine dipstick NEGATIVE NEGATIVE   Bilirubin Urine NEGATIVE NEGATIVE   Ketones, ur 5 (A) NEGATIVE mg/dL   Protein, ur NEGATIVE NEGATIVE mg/dL   Nitrite POSITIVE (A) NEGATIVE   Leukocytes,Ua TRACE (A) NEGATIVE   RBC / HPF 0-5 0 - 5 RBC/hpf   WBC, UA 21-50 0 - 5 WBC/hpf   Bacteria, UA RARE (A) NONE SEEN   Squamous Epithelial / LPF 0-5 0 -  5   Mucus PRESENT   I-Stat beta hCG blood, ED  Result Value Ref Range   I-stat hCG, quantitative <5.0 <5 mIU/mL   Comment 3           Laboratory interpretation all normal except prob UTI, leukocytosis, elevation of LFT's     EKG None  Radiology Ct Renal Stone Study  Result Date: 09/11/2018 CLINICAL DATA:  Flank pain EXAM: CT ABDOMEN AND PELVIS WITHOUT CONTRAST TECHNIQUE: Multidetector CT imaging of the abdomen and pelvis was performed following the standard protocol without IV contrast. COMPARISON:  12/01/2010 FINDINGS: Lower chest: The lung bases are clear. The heart size is normal. Hepatobiliary: The liver is normal. Normal gallbladder.There is no biliary ductal dilation. Pancreas: Normal contours without ductal dilatation. No peripancreatic fluid collection. Spleen: The spleen is enlarged measuring approximately 13 cm craniocaudad. Adrenals/Urinary Tract: --Adrenal glands: No adrenal hemorrhage. --Right kidney/ureter: There is mild right-sided collecting system dilatation. There is mild right-sided peripancreatic fat stranding. --Left kidney/ureter: There is mild left-sided collecting system dilatation. --Urinary  bladder: There are few foci of gas within the urinary bladder. Stomach/Bowel: --Stomach/Duodenum: No hiatal hernia or other gastric abnormality. Normal duodenal course and caliber. --Small bowel: No dilatation or inflammation. --Colon: No focal abnormality. --Appendix: Normal. Vascular/Lymphatic: Normal course and caliber of the major abdominal vessels. --No retroperitoneal lymphadenopathy. --No mesenteric lymphadenopathy. --No pelvic or inguinal lymphadenopathy. Reproductive: There are bilateral tubal ligation devices in place. Other: No ascites or free air. The abdominal wall is normal. Musculoskeletal. No acute displaced fractures. IMPRESSION: 1. Overall findings suspicious for an ascending urinary tract infection as detailed above, with possible right-sided pyelonephritis in the appropriate clinical setting. There are no radiopaque obstructing kidney stones. 2. Splenomegaly Electronically Signed   By: Katherine Mantle M.D.   On: 09/11/2018 01:29    Procedures Procedures (including critical care time)  Medications Ordered in ED Medications  sodium chloride 0.9 % bolus 1,000 mL (0 mLs Intravenous Stopped 09/11/18 0243)  ondansetron (ZOFRAN) injection 4 mg (4 mg Intravenous Given 09/11/18 0050)  ketorolac (TORADOL) 30 MG/ML injection 30 mg (30 mg Intravenous Given 09/11/18 0051)  dicyclomine (BENTYL) injection 20 mg (20 mg Intramuscular Given 09/11/18 0053)  ciprofloxacin (CIPRO) IVPB 400 mg (0 mg Intravenous Stopped 09/11/18 0336)  fentaNYL (SUBLIMAZE) injection 50 mcg (50 mcg Intravenous Given 09/11/18 0339)     Initial Impression / Assessment and Plan / ED Course  I have reviewed the triage vital signs and the nursing notes.  Pertinent labs & imaging results that were available during my care of the patient were reviewed by me and considered in my medical decision making (see chart for details).    Patient was given IV fluids.  Since her pain is radiating into her right flank CT renal was done.  She  was given IV Toradol for pain and IM Bentyl for abdominal cramping.    She was given Zofran for nausea.  Patient has amoxicillin allergy, she was given IV Cipro after reviewing her urinalysis and her CT results.  The Cipro will also treat her enteropathic E. coli if it is still present.  3:00 AM patient states her pain starting to return.  We discussed her test results.  Her antibiotics have just been hung.  Patient's LFTs were minimally elevated her last visit on July 17 and are more elevated now.  Because of that patient is scheduled to return to get an outpatient right upper quadrant ultrasound done to look at her gallbladder.  On CT scan her  gallbladder appears normal however that is not the test of choice to evaluate the gallbladder well.  When I reviewed treatment of Cryptosporidium in a non-immune compromised patient it states nitazoxanide can be used twice a day for 3 days.  This was prescribed for the patient.  Final Clinical Impressions(s) / ED Diagnoses   Final diagnoses:  Epigastric abdominal pain  RUQ pain  Right flank pain  Pyelonephritis    ED Discharge Orders         Ordered    nitazoxanide (ALINIA) 500 MG tablet  2 times daily with meals     09/11/18 0147    ciprofloxacin (CIPRO) 500 MG tablet  2 times daily     09/11/18 0147    US Abdomen Limited RUQ/Gall Gladder     09/11/18 0334    ondansetron (ZOFRAN) 4 MG tablet  Every 8 hours PRN     09/11/18 0334         Plan discharge   Devoria AlbeIva Crystian Frith, MD, Concha PyoFACEP    Margaurite Salido, MD 09/11/18 [phone]

## 2018-09-11 NOTE — Telephone Encounter (Signed)
Pt called with concerns about cost of the abx prescribed last night for her cryptosporidium stool infection, would cost $1000. Pt has no insurance.  Alternative found paromomycin 500 mg tid x 7 days.  E scribed to her pharmacy of choice, [facility], [address].  With [facility] should be less than $100.  Pt will get this filled.

## 2018-09-11 NOTE — ED Notes (Signed)
Evalee Jefferson PA Eprescribed pt another medication to [facility] on [address] as requested by pt.

## 2018-09-11 NOTE — ED Provider Notes (Signed)
Pt returned for her gallbladder US after last nights visit here.  [place] negative for acute cholecystitis, no sludge or stones present.  She felt improved but sx not resolved.  Encouraged to complete abx prescribed last night. F/u with pcp for a recheck in 1 week if not resolved. Discussed rechecked of lft's and possible need of further testing, Hida scan if sx persist or return.  Return precautions also outlined.   Evalee Jefferson, PA-C 09/11/18 1032    Maudie Flakes, MD 09/16/18 0700

## 2018-09-13 LAB — URINE CULTURE
Culture: >=100000 — AB
Special Requests: NORMAL

## 2018-09-14 ENCOUNTER — Telehealth: Payer: Self-pay | Admitting: Emergency Medicine

## 2018-09-14 NOTE — Telephone Encounter (Signed)
Post ED Visit - Positive Culture Follow-up  Culture report reviewed by antimicrobial stewardship pharmacist: [place] Team []  Elenor Quinones, Pharm.D. []  Heide Guile, Pharm.D., BCPS AQ-ID []  Parks Neptune, Pharm.D., BCPS []  Alycia Rossetti, Pharm.D., BCPS []  [place], Pharm.D., BCPS, AAHIVP []  Legrand Como, Pharm.D., BCPS, AAHIVP []  Salome Arnt, PharmD, BCPS []  Johnnette Gourd, PharmD, BCPS []  Hughes Better, PharmD, BCPS []  Leeroy Cha, PharmD []  Laqueta Linden, PharmD, BCPS []  Albertina Parr, PharmD  [place] Team []  Leodis Sias, PharmD []  Lindell Spar, PharmD []  Royetta Asal, PharmD []  Graylin Shiver, Rph []  Rema Fendt) Glennon Mac, PharmD []  Arlyn Dunning, PharmD []  Netta Cedars, PharmD []  Dia Sitter, PharmD []  Leone Haven, PharmD []  Gretta Arab, PharmD []  Theodis Shove, PharmD []  Peggyann Juba, PharmD []  Reuel Boom, PharmD   Positive urine culture Treated with ciprofloxacin, organism sensitive to the same and no further patient follow-up is required at this time.  Hazle Nordmann 09/14/2018, 9:49 AM

## 2018-10-26 MED FILL — VALACYCLOVIR HCL 500 MG TAB: 500 | 30 days supply | Qty: 30 | Fill #0

## 2018-11-18 MED FILL — VALACYCLOVIR HCL 500 MG TAB: 500 | 30 days supply | Qty: 30 | Fill #0

## 2019-03-02 MED FILL — VALACYCLOVIR HCL 500 MG TAB: 500 | 30 days supply | Qty: 30 | Fill #1

## 2019-04-15 MED FILL — VALACYCLOVIR HCL 500 MG TAB: 500 | 30 days supply | Qty: 30 | Fill #2

## 2019-05-30 MED FILL — VALACYCLOVIR HCL 500 MG TAB: 500 | 30 days supply | Qty: 30 | Fill #3

## 2019-07-14 MED FILL — VALACYCLOVIR HCL 500 MG TAB: 500 | 30 days supply | Qty: 30 | Fill #4

## 2019-08-18 MED FILL — VALACYCLOVIR HCL 500 MG TAB: 500 | 30 days supply | Qty: 30 | Fill #5

## 2019-10-13 MED FILL — VALACYCLOVIR HCL 500 MG TAB: 500 | 30 days supply | Qty: 30 | Fill #6

## 2019-12-08 ENCOUNTER — Other Ambulatory Visit (HOSPITAL_BASED_OUTPATIENT_CLINIC_OR_DEPARTMENT_OTHER): Payer: Self-pay | Admitting: Obstetrics and Gynecology

## 2019-12-08 MED FILL — VALACYCLOVIR HCL 500 MG TAB: 500 | 90 days supply | Qty: 90 | Fill #0

## 2020-05-02 MED FILL — VALACYCLOVIR HCL 500 MG TAB: 500 | 90 days supply | Qty: 90 | Fill #1

## 2020-08-30 ENCOUNTER — Other Ambulatory Visit (HOSPITAL_BASED_OUTPATIENT_CLINIC_OR_DEPARTMENT_OTHER): Payer: Self-pay

## 2020-08-30 MED FILL — Valacyclovir HCl Tab 500 MG: ORAL | 90 days supply | Qty: 90 | Fill #0 | Status: AC

## 2020-08-31 ENCOUNTER — Other Ambulatory Visit (HOSPITAL_BASED_OUTPATIENT_CLINIC_OR_DEPARTMENT_OTHER): Payer: Self-pay

## 2020-09-26 ENCOUNTER — Other Ambulatory Visit: Payer: Self-pay

## 2020-09-26 ENCOUNTER — Encounter (HOSPITAL_BASED_OUTPATIENT_CLINIC_OR_DEPARTMENT_OTHER): Payer: Self-pay | Admitting: Emergency Medicine

## 2020-09-26 ENCOUNTER — Emergency Department (HOSPITAL_BASED_OUTPATIENT_CLINIC_OR_DEPARTMENT_OTHER)
Admission: EM | Admit: 2020-09-26 | Discharge: 2020-09-26 | Disposition: A | Discharge status: Home / Self Care | Payer: Self-pay | Attending: Emergency Medicine | Admitting: Emergency Medicine

## 2020-09-26 DIAGNOSIS — L02411 Cutaneous abscess of right axilla: Secondary | ICD-10-CM | POA: Insufficient documentation

## 2020-09-26 MED ORDER — LIDOCAINE-EPINEPHRINE (PF) 1 %-1:200000 IJ SOLN
30.0000 mL | Freq: Once | INTRAMUSCULAR | Status: AC
  Administered 2020-09-26: 30 mL

## 2020-09-26 MED ORDER — LIDOCAINE-EPINEPHRINE (PF) 2 %-1:200000 IJ SOLN: 20.0000 mL | Freq: Once | INTRAMUSCULAR | Status: DC

## 2020-09-26 MED ORDER — LIDOCAINE HCL (PF) 1 % IJ SOLN
5.0000 mL | Freq: Once | INTRAMUSCULAR | Status: DC
  Filled 2020-09-26: qty 5

## 2020-09-26 NOTE — ED Provider Notes (Signed)
[facility] EMERGENCY DEPARTMENT Provider Note   CSN: [id number] Arrival date & time: 09/26/20  0841     History Chief Complaint  Patient presents with   Abscess    Donna [name] is a 30 y.o. female.  HPI 30 year old female presents with right axillary swelling.  Originally started on 8/12 but really noticed it starting to hurt 2 days ago.  It has been swollen and red.  She thinks it originally started like a hair bump but has grown out.  No fevers.  No drainage.  Past Medical History:  Diagnosis Date   Genital HSV    SVD (spontaneous vaginal delivery)    x 3 (1 set of twins)    Patient Active Problem List   Diagnosis Date Noted   Twin gestation, dichorionic diamniotic 04/01/2016   Term pregnancy 03/20/2014   Status post normal vaginal delivery 03/20/2014    Past Surgical History:  Procedure Laterality Date   BREAST ENHANCEMENT SURGERY     BREAST SURGERY     Augmentation 2010   LAPAROSCOPIC TUBAL LIGATION Bilateral 06/06/2016   Procedure: LAPAROSCOPIC TUBAL LIGATION;  Surgeon: Essie Hart, MD;  Location: [facility];  Service: Gynecology;  Laterality: Bilateral;  WITH FILSHIE CLIPS   WISDOM TOOTH EXTRACTION       OB History     Gravida  4   Para  3   Term  3   Preterm  0   AB  1   Living  4      SAB  1   IAB  0   Ectopic  0   Multiple  1   Live Births  4           Family History  Problem Relation Age of Onset   Hypertension Mother    Hypertension Maternal Grandfather     Social History   Tobacco Use   Smoking status: Never   Smokeless tobacco: Never  Vaping Use   Vaping Use: Never used  Substance Use Topics   Alcohol use: Yes    Comment: occ   Drug use: No    Home Medications Prior to Admission medications   Medication Sig Start Date End Date Taking? Authorizing Provider  azithromycin (ZITHROMAX) 250 MG tablet Take 1 tablet (250 mg total) by mouth daily. Take first 2 tablets together, then 1 every day until  finished. 12/02/17   Petrucelli, Samantha R, PA-C  calcium carbonate (TUMS - DOSED IN MG ELEMENTAL CALCIUM) 500 MG chewable tablet Chew 1 tablet by mouth as needed for indigestion or heartburn (Patient takes up to 6 tablets daily.).    [provider]  ciprofloxacin (CIPRO) 500 MG tablet Take 1 tablet (500 mg total) by mouth 2 (two) times daily. 09/11/18   Devoria Albe, MD  HYDROcodone-acetaminophen (NORCO/VICODIN) 5-325 MG tablet Take 1-2 tablets by mouth every 4 (four) hours as needed for moderate pain or severe pain. 06/06/16   Essie Hart, MD  ibuprofen (ADVIL,MOTRIN) 800 MG tablet Take 1 tablet (800 mg total) by mouth every 8 (eight) hours as needed for cramping. 06/06/16   Essie Hart, MD  nitazoxanide (ALINIA) 500 MG tablet Take 1 tablet (500 mg total) by mouth 2 (two) times daily with a meal. 09/11/18   Devoria Albe, MD  norethindrone-ethinyl estradiol 1/35 (ORTHO-NOVUM 1/35, 28,) tablet Take 1 tablet by mouth daily. 06/06/16   Essie Hart, MD  ondansetron (ZOFRAN) 4 MG tablet Take 1 tablet (4 mg total) by mouth every 8 (eight) hours  as needed for nausea or vomiting. 09/11/18   Devoria Albe, MD  Prenatal Vit-Fe Fumarate-FA (PRENATAL MULTIVITAMIN) TABS Take 1 tablet by mouth daily at 12 noon.    [provider]  sertraline (ZOLOFT) 50 MG tablet Take 50 mg by mouth at bedtime.     [provider]  valACYclovir (VALTREX) 500 MG tablet Take 500 mg by mouth at bedtime.     [provider]  valACYclovir (VALTREX) 500 MG tablet TAKE 1 TABLET BY MOUTH EVERY DAY 12/08/19 12/07/20  Philip Aspen, DO    Allergies    Amoxicillin and Clindamycin/lincomycin  Review of Systems   Review of Systems  Constitutional:  Negative for fever.  Skin:  Positive for color change.   Physical Exam Updated Vital Signs BP 128/79 (BP Location: Left Arm)   Pulse 79   Temp 98.8 F (37.1 C) (Oral)   Resp 18   Ht 5\' 5"  (1.651 m)   Wt 71.7 kg   LMP 09/23/2020 (Exact Date)   SpO2 100%    BMI 26.29 kg/m   Physical Exam Vitals and nursing note reviewed.  Constitutional:      General: She is not in acute distress.    Appearance: She is well-developed. She is not ill-appearing or diaphoretic.  HENT:     Head: Normocephalic and atraumatic.     Right Ear: External ear normal.     Left Ear: External ear normal.     Nose: Nose normal.  Eyes:     General:        Right eye: No discharge.        Left eye: No discharge.  Pulmonary:     Effort: Pulmonary effort is normal.  Abdominal:     General: There is no distension.  Skin:    General: Skin is warm and dry.     Findings: Erythema present.     Comments: ~2-3 cm circular fluctuant lesion c/w abscess in the right armpit. No streaking/surrounding cellulitis   Neurological:     Mental Status: She is alert.  Psychiatric:        Mood and Affect: Mood is not anxious.    ED Results / Procedures / Treatments   Labs (all labs ordered are listed, but only abnormal results are displayed) Labs Reviewed - No data to display  EKG None  Radiology No results found.  Procedures .08/16/2022Incision and Drainage  Date/Time: 09/26/2020 10:13 AM Performed by: 09/28/2020, MD Authorized by: Pricilla Loveless, MD   Consent:    Consent obtained:  Verbal   Consent given by:  Patient Universal protocol:    Patient identity confirmed:  Verbally with patient Location:    Type:  Abscess   Size:  3 cm   Location:  Upper extremity   Upper extremity location:  Arm   Arm location:  R upper arm Pre-procedure details:    Skin preparation:  Povidone-iodine Anesthesia:    Anesthesia method:  Local infiltration   Local anesthetic:  Lidocaine 1% WITH epi Procedure type:    Complexity:  Simple Procedure details:    Incision types:  Elliptical   Incision depth:  Dermal   Wound management:  Probed and deloculated   Drainage:  Purulent   Drainage amount:  Copious   Packing materials:  None Post-procedure details:    Procedure completion:   Tolerated well, no immediate complications   Medications Ordered in ED Medications  lidocaine-EPINEPHrine (XYLOCAINE-EPINEPHrine) 1 %-1:200000 (PF) injection 30 mL (30 mLs Other Given 09/26/20  6606)    ED Course  I have reviewed the triage vital signs and the nursing notes.  Pertinent labs & imaging results that were available during my care of the patient were reviewed by me and considered in my medical decision making (see chart for details).    MDM Rules/Calculators/A&P                           Patient presents with a simple upper arm/axillary abscess.  This was incised and drained as above.  No systemic symptoms.  It is not large or with surrounding cellulitis to think that he needs antibiotics and so I have suggested warm compresses and we have discussed return precautions/supportive care. Final Clinical Impression(s) / ED Diagnoses Final diagnoses:  Abscess of axilla, right    Rx / DC Orders ED Discharge Orders     None        Pricilla Loveless, MD 09/26/20 1021

## 2020-09-26 NOTE — ED Triage Notes (Signed)
Patient with large cyst/abscess under R arm. Red, painful. Noticed 8/12 and has been getting larger since.

## 2020-10-07 ENCOUNTER — Emergency Department (HOSPITAL_BASED_OUTPATIENT_CLINIC_OR_DEPARTMENT_OTHER)
Admission: EM | Admit: 2020-10-07 | Discharge: 2020-10-07 | Disposition: A | Discharge status: Home / Self Care | Payer: Self-pay | Attending: Emergency Medicine | Admitting: Emergency Medicine

## 2020-10-07 ENCOUNTER — Other Ambulatory Visit: Payer: Self-pay

## 2020-10-07 DIAGNOSIS — L02411 Cutaneous abscess of right axilla: Secondary | ICD-10-CM | POA: Insufficient documentation

## 2020-10-07 MED ORDER — DOXYCYCLINE HYCLATE 100 MG PO CAPS
100.0000 mg | ORAL_CAPSULE | Freq: Two times a day (BID) | ORAL | 0 refills | Status: AC
  Filled 2020-10-07: qty 20, 10d supply, fill #0

## 2020-10-07 MED ORDER — LIDOCAINE-EPINEPHRINE (PF) 2 %-1:200000 IJ SOLN
10.0000 mL | Freq: Once | INTRAMUSCULAR | Status: AC
  Administered 2020-10-07: 10 mL via INTRADERMAL
  Filled 2020-10-07: qty 20

## 2020-10-07 MED ORDER — CHLORHEXIDINE GLUCONATE 4 % EX LIQD
Freq: Every day | CUTANEOUS | 0 refills | Status: AC | PRN
  Filled 2020-10-07: qty 118, 30d supply, fill #0

## 2020-10-07 MED ORDER — DOXYCYCLINE HYCLATE 100 MG PO TABS
100.0000 mg | ORAL_TABLET | Freq: Once | ORAL | Status: AC
  Administered 2020-10-07: 100 mg via ORAL
  Filled 2020-10-07: qty 1

## 2020-10-07 MED ORDER — HYDROCODONE-ACETAMINOPHEN 5-325 MG PO TABS
1.0000 | ORAL_TABLET | Freq: Once | ORAL | Status: AC
  Administered 2020-10-07: 1 via ORAL
  Filled 2020-10-07: qty 1

## 2020-10-07 NOTE — ED Provider Notes (Signed)
[facility] EMERGENCY DEPARTMENT Provider Note   CSN: [id number] Arrival date & time: 10/07/20  1520     History Chief Complaint  Patient presents with   Abscess    Donna [name] is a 30 y.o. female who was seen in the emergency department 8/17 for right axillary abscess which was drained with recurrent infection at this time.  States that she was not prescribed any antibiotics in the outpatient setting.  Upon review of her chart it appears that this was inappropriate disposition plan given do not have any surrounding cellulitic findings at the time.  Denies any fevers, chills with nausea, vomiting, decreased appetite since recurring pain and erythema x2 days but does endorse worsening pain and erythema extending down the  medial upper arm.  I personally read this patient's medical recordIs.  She has history of pregnancies and genital HSV, otherwise carries no medical diagnoses.  She is on Valtrex daily.  HPI     Past Medical History:  Diagnosis Date   Genital HSV    SVD (spontaneous vaginal delivery)    x 3 (1 set of twins)    Patient Active Problem List   Diagnosis Date Noted   Twin gestation, dichorionic diamniotic 04/01/2016   Term pregnancy 03/20/2014   Status post normal vaginal delivery 03/20/2014    Past Surgical History:  Procedure Laterality Date   BREAST ENHANCEMENT SURGERY     BREAST SURGERY     Augmentation 2010   LAPAROSCOPIC TUBAL LIGATION Bilateral 06/06/2016   Procedure: LAPAROSCOPIC TUBAL LIGATION;  Surgeon: Essie HartWalda Pinn, MD;  Location: [facility];  Service: Gynecology;  Laterality: Bilateral;  WITH FILSHIE CLIPS   WISDOM TOOTH EXTRACTION       OB History     Gravida  4   Para  3   Term  3   Preterm  0   AB  1   Living  4      SAB  1   IAB  0   Ectopic  0   Multiple  1   Live Births  4           Family History  Problem Relation Age of Onset   Hypertension Mother    Hypertension Maternal Grandfather      Social History   Tobacco Use   Smoking status: Never   Smokeless tobacco: Never  Vaping Use   Vaping Use: Never used  Substance Use Topics   Alcohol use: Yes    Comment: occ   Drug use: No    Home Medications Prior to Admission medications   Medication Sig Start Date End Date Taking? Authorizing Provider  azithromycin (ZITHROMAX) 250 MG tablet Take 1 tablet (250 mg total) by mouth daily. Take first 2 tablets together, then 1 every day until finished. 12/02/17   Petrucelli, Samantha R, PA-C  calcium carbonate (TUMS - DOSED IN MG ELEMENTAL CALCIUM) 500 MG chewable tablet Chew 1 tablet by mouth as needed for indigestion or heartburn (Patient takes up to 6 tablets daily.).    [provider]  ciprofloxacin (CIPRO) 500 MG tablet Take 1 tablet (500 mg total) by mouth 2 (two) times daily. 09/11/18   Devoria AlbeKnapp, Iva, MD  HYDROcodone-acetaminophen (NORCO/VICODIN) 5-325 MG tablet Take 1-2 tablets by mouth every 4 (four) hours as needed for moderate pain or severe pain. 06/06/16   Essie HartPinn, Walda, MD  ibuprofen (ADVIL,MOTRIN) 800 MG tablet Take 1 tablet (800 mg total) by mouth every 8 (eight) hours as needed for  cramping. 06/06/16   Essie Hart, MD  nitazoxanide Bethann Humble) 500 MG tablet Take 1 tablet (500 mg total) by mouth 2 (two) times daily with a meal. 09/11/18   Devoria Albe, MD  norethindrone-ethinyl estradiol 1/35 (ORTHO-NOVUM 1/35, 28,) tablet Take 1 tablet by mouth daily. 06/06/16   Essie Hart, MD  ondansetron (ZOFRAN) 4 MG tablet Take 1 tablet (4 mg total) by mouth every 8 (eight) hours as needed for nausea or vomiting. 09/11/18   Devoria Albe, MD  Prenatal Vit-Fe Fumarate-FA (PRENATAL MULTIVITAMIN) TABS Take 1 tablet by mouth daily at 12 noon.    [provider]  sertraline (ZOLOFT) 50 MG tablet Take 50 mg by mouth at bedtime.     [provider]  valACYclovir (VALTREX) 500 MG tablet Take 500 mg by mouth at bedtime.     [provider]  valACYclovir (VALTREX) 500 MG  tablet TAKE 1 TABLET BY MOUTH EVERY DAY 12/08/19 12/07/20  Philip Aspen, DO    Allergies    Amoxicillin and Clindamycin/lincomycin  Review of Systems   Review of Systems  Constitutional: Negative.   HENT: Negative.    Respiratory: Negative.    Cardiovascular: Negative.   Gastrointestinal: Negative.   Genitourinary: Negative.   Musculoskeletal: Negative.   Skin:  Positive for wound.  Neurological: Negative.    Physical Exam Updated Vital Signs BP 115/74 (BP Location: Right Arm)   Pulse 76   Temp 98.2 F (36.8 C) (Oral)   Resp (!) 79   Ht 5\' 5"  (1.651 m)   Wt 72 kg   LMP 09/23/2020 (Exact Date)   SpO2 100%   BMI 26.41 kg/m   Physical Exam Vitals and nursing note reviewed.  HENT:     Head: Normocephalic and atraumatic.     Mouth/Throat:     Mouth: Mucous membranes are moist.     Pharynx: No oropharyngeal exudate or posterior oropharyngeal erythema.  Eyes:     General:        Right eye: No discharge.        Left eye: No discharge.     Extraocular Movements: Extraocular movements intact.     Conjunctiva/sclera: Conjunctivae normal.     Pupils: Pupils are equal, round, and reactive to light.  Cardiovascular:     Rate and Rhythm: Normal rate and regular rhythm.     Pulses: Normal pulses.     Heart sounds: Normal heart sounds. No murmur heard. Pulmonary:     Effort: Pulmonary effort is normal. No respiratory distress.     Breath sounds: Normal breath sounds. No wheezing or rales.  Abdominal:     General: Bowel sounds are normal. There is no distension.     Palpations: Abdomen is soft.     Tenderness: There is no abdominal tenderness. There is no guarding or rebound.  Musculoskeletal:        General: No deformity.       Arms:     Cervical back: Neck supple.     Right lower leg: No edema.     Left lower leg: No edema.  Skin:    General: Skin is warm and dry.     Capillary Refill: Capillary refill takes less than 2 seconds.     Findings: Abscess present.   Neurological:     General: No focal deficit present.     Mental Status: She is alert. Mental status is at baseline.  Psychiatric:        Mood and Affect: Mood normal.  ED Results / Procedures / Treatments   Labs (all labs ordered are listed, but only abnormal results are displayed) Labs Reviewed - No data to display  EKG None  Radiology No results found.  Procedures .Marland KitchenIncision and Drainage  Date/Time: 10/07/2020 9:24 PM Performed by: Paris Lore, PA-C Authorized by: Paris Lore, PA-C   Consent:    Consent obtained:  Verbal   Consent given by:  Patient   Risks discussed:  Bleeding, incomplete drainage, pain and damage to other organs   Alternatives discussed:  No treatment Universal protocol:    Procedure explained and questions answered to patient or proxy's satisfaction: yes     Relevant documents present and verified: yes     Test results available : yes     Imaging studies available: yes     Required blood products, implants, devices, and special equipment available: yes     Site/side marked: yes     Immediately prior to procedure, a time out was called: yes     Patient identity confirmed:  Verbally with patient Location:    Type:  Abscess   Size:  3x2 cm   Location:  Upper extremity   Upper extremity location: Rt. axilla. Pre-procedure details:    Skin preparation:  Betadine Sedation:    Sedation type:  None Anesthesia:    Anesthesia method:  Local infiltration   Local anesthetic:  Lidocaine 2% WITH epi Procedure type:    Complexity:  Simple Procedure details:    Incision types:  Single straight   Incision depth:  Subcutaneous   Wound management:  Probed and deloculated, irrigated with saline and extensive cleaning   Drainage:  Purulent   Drainage amount:  Moderate   Wound treatment:  Wound left open   Packing materials:  None Post-procedure details:    Procedure completion:  Tolerated well, no immediate  complications .Marland KitchenIncision and Drainage  Date/Time: 10/07/2020 9:25 PM Performed by: Paris Lore, PA-C Authorized by: Paris Lore, PA-C   Consent:    Consent obtained:  Verbal   Consent given by:  Patient   Risks discussed:  Bleeding, incomplete drainage, pain and damage to other organs   Alternatives discussed:  No treatment Universal protocol:    Procedure explained and questions answered to patient or proxy's satisfaction: yes     Relevant documents present and verified: yes     Test results available : yes     Imaging studies available: yes     Required blood products, implants, devices, and special equipment available: yes     Site/side marked: yes     Immediately prior to procedure, a time out was called: yes     Patient identity confirmed:  Verbally with patient Location:    Type:  Abscess   Size:  2.5x2 cm   Location:  Upper extremity   Upper extremity location: rt. axilla. Pre-procedure details:    Skin preparation:  Betadine Anesthesia:    Anesthesia method:  Local infiltration   Local anesthetic:  Lidocaine 2% WITH epi Procedure type:    Complexity:  Simple Procedure details:    Incision types:  Single straight   Incision depth:  Subcutaneous   Wound management:  Probed and deloculated, irrigated with saline and extensive cleaning   Drainage:  Purulent   Drainage amount:  Moderate   Wound treatment:  Wound left open   Packing materials:  None Post-procedure details:    Procedure completion:  Tolerated well, no immediate complications  Medications Ordered in ED Medications  doxycycline (VIBRA-TABS) tablet 100 mg (has no administration in time range)  HYDROcodone-acetaminophen (NORCO/VICODIN) 5-325 MG per tablet 1 tablet (has no administration in time range)  lidocaine-EPINEPHrine (XYLOCAINE W/EPI) 2 %-1:200000 (PF) injection 10 mL (10 mLs Intradermal Given by Other 10/07/20 2037)    ED Course  I have reviewed the triage vital signs and  the nursing notes.  Pertinent labs & imaging results that were available during my care of the patient were reviewed by me and considered in my medical decision making (see chart for details).    MDM Rules/Calculators/A&P                         30 year old female presents with concern for swelling and pain in the right axilla, recently had I&D 1 week ago.   Differential diagnosis includes but is not limited to recurrent abscess, cellulitis, erysipelas, hidradenitis suppurativa.  Vital signs are normal on intake.  Her pulm exam is normal abdominal exam is benign.  Skin exam revealed 2 abscesses in the right axilla with surrounding erythema, induration, and tenderness palpation concerning for cellulitis.  Abscesses were drained as above after performing a bedside ultrasound which did confirm abscess amenable to drainage x2.  Patient tolerated the procedures well and was administered first dose of antibiotics in the emergency department.  Discharged with prescription for doxycycline and chlorhexidine wash, recommend outpatient follow-up with PCP.  No further work-up warranted needed this time given lack of systemic symptoms and normal vital signs.  Makylee voiced understanding of her medical evaluation treatment.  Each of her questions was answered to her expressed satisfaction.  Return precautions given.  Patient is well-appearing, stable, and appropriate for discharge at this time.  This chart was dictated using voice recognition software, Dragon. Despite the best efforts of this provider to proofread and correct errors, errors may still occur which can change documentation meaning.  Final Clinical Impression(s) / ED Diagnoses Final diagnoses:  None    Rx / DC Orders ED Discharge Orders     None        Sherrilee Gilles 10/07/20 2128    Tegeler, Canary Brim, MD 10/07/20 2329

## 2020-10-07 NOTE — ED Triage Notes (Signed)
Recurrent abscesses under Right arm  Site from 8/17 healed now 2 additional cysts are present

## 2020-10-07 NOTE — Discharge Instructions (Addendum)
You were seen in the ER today for the swelling and pain in your armpit.  You had 2 abscesses which were drained in the emergency department.  You have been been started on antibiotics to treat your skin infection surrounding the abscesses, the antibiotic is called doxycycline.  Please be aware this antibiotic may make you more sensitive to the sun.  Please take as prescribed for the entire course.  You may take alternate Tylenol and ibuprofen as needed for discomfort.  Additionally please apply warm compresses to encourage drainage and a plastic may develop in the next few days.  You have been prescribed an antiseptic wash with which you should wash your armpits daily when showering.  Return to the ER with any fevers, chills, nausea or vomiting does not stop, extension of the redness down your forearm or anterior chest wall, or any other new severe symptoms.

## 2020-10-08 ENCOUNTER — Other Ambulatory Visit (HOSPITAL_BASED_OUTPATIENT_CLINIC_OR_DEPARTMENT_OTHER): Payer: Self-pay

## 2020-10-09 ENCOUNTER — Other Ambulatory Visit (HOSPITAL_BASED_OUTPATIENT_CLINIC_OR_DEPARTMENT_OTHER): Payer: Self-pay

## 2020-10-19 ENCOUNTER — Other Ambulatory Visit (HOSPITAL_BASED_OUTPATIENT_CLINIC_OR_DEPARTMENT_OTHER): Payer: Self-pay

## 2020-11-26 ENCOUNTER — Emergency Department (HOSPITAL_BASED_OUTPATIENT_CLINIC_OR_DEPARTMENT_OTHER)
Admission: EM | Admit: 2020-11-26 | Discharge: 2020-11-26 | Disposition: A | Discharge status: Home / Self Care | Payer: Self-pay | Attending: Emergency Medicine | Admitting: Emergency Medicine

## 2020-11-26 ENCOUNTER — Encounter (HOSPITAL_BASED_OUTPATIENT_CLINIC_OR_DEPARTMENT_OTHER): Payer: Self-pay

## 2020-11-26 ENCOUNTER — Other Ambulatory Visit: Payer: Self-pay

## 2020-11-26 ENCOUNTER — Emergency Department (HOSPITAL_BASED_OUTPATIENT_CLINIC_OR_DEPARTMENT_OTHER): Payer: Self-pay

## 2020-11-26 DIAGNOSIS — Y9302 Activity, running: Secondary | ICD-10-CM | POA: Insufficient documentation

## 2020-11-26 DIAGNOSIS — S93402A Sprain of unspecified ligament of left ankle, initial encounter: Secondary | ICD-10-CM | POA: Insufficient documentation

## 2020-11-26 DIAGNOSIS — M25572 Pain in left ankle and joints of left foot: Secondary | ICD-10-CM | POA: Insufficient documentation

## 2020-11-26 DIAGNOSIS — X501XXA Overexertion from prolonged static or awkward postures, initial encounter: Secondary | ICD-10-CM | POA: Insufficient documentation

## 2020-11-26 NOTE — Discharge Instructions (Addendum)
Call your primary care doctor in 4-5 days for a follow up.  Return immediately back to the ER if:  Your symptoms worsen within the next 12-24 hours. You develop new symptoms such as new fevers, persistent vomiting, new pain, shortness of breath, or new weakness or numbness, or if you have any other concerns.

## 2020-11-26 NOTE — ED Provider Notes (Signed)
[facility] EMERGENCY DEPARTMENT Provider Note   CSN: [id number] Arrival date & time: 11/26/20  1147     History Chief Complaint  Patient presents with   Ankle Injury    Donna [name] is a 30 y.o. female.  Patient presents complaining of left ankle pain.  She states is been hurting for the past 3 days when she was running in heels and rolled her left ankle.  Denies fall or injury elsewhere.  No fever no cough no vomiting or diarrhea.  Taking Tylenol at home with some improvement.  Pain was persistent so she presents to the ER here today.      Past Medical History:  Diagnosis Date   Genital HSV    SVD (spontaneous vaginal delivery)    x 3 (1 set of twins)    Patient Active Problem List   Diagnosis Date Noted   Twin gestation, dichorionic diamniotic 04/01/2016   Term pregnancy 03/20/2014   Status post normal vaginal delivery 03/20/2014    Past Surgical History:  Procedure Laterality Date   BREAST ENHANCEMENT SURGERY     BREAST SURGERY     Augmentation 2010   LAPAROSCOPIC TUBAL LIGATION Bilateral 06/06/2016   Procedure: LAPAROSCOPIC TUBAL LIGATION;  Surgeon: Essie Hart, MD;  Location: [facility];  Service: Gynecology;  Laterality: Bilateral;  WITH FILSHIE CLIPS   WISDOM TOOTH EXTRACTION       OB History     Gravida  4   Para  3   Term  3   Preterm  0   AB  1   Living  4      SAB  1   IAB  0   Ectopic  0   Multiple  1   Live Births  4           Family History  Problem Relation Age of Onset   Hypertension Mother    Hypertension Maternal Grandfather     Social History   Tobacco Use   Smoking status: Never   Smokeless tobacco: Never  Vaping Use   Vaping Use: Never used  Substance Use Topics   Alcohol use: Yes    Comment: occ   Drug use: No    Home Medications Prior to Admission medications   Medication Sig Start Date End Date Taking? Authorizing Provider  calcium carbonate (TUMS - DOSED IN MG ELEMENTAL CALCIUM)  500 MG chewable tablet Chew 1 tablet by mouth as needed for indigestion or heartburn (Patient takes up to 6 tablets daily.).    [provider]  chlorhexidine (HIBICLENS) 4 % external liquid Apply on the skin daily as needed. 10/07/20   Sponseller, Eugene Gavia, PA-C  HYDROcodone-acetaminophen (NORCO/VICODIN) 5-325 MG tablet Take 1-2 tablets by mouth every 4 (four) hours as needed for moderate pain or severe pain. 06/06/16   Essie Hart, MD  ibuprofen (ADVIL,MOTRIN) 800 MG tablet Take 1 tablet (800 mg total) by mouth every 8 (eight) hours as needed for cramping. 06/06/16   Essie Hart, MD  norethindrone-ethinyl estradiol 1/35 (ORTHO-NOVUM 1/35, 28,) tablet Take 1 tablet by mouth daily. 06/06/16   Essie Hart, MD  ondansetron (ZOFRAN) 4 MG tablet Take 1 tablet (4 mg total) by mouth every 8 (eight) hours as needed for nausea or vomiting. 09/11/18   Devoria Albe, MD  Prenatal Vit-Fe Fumarate-FA (PRENATAL MULTIVITAMIN) TABS Take 1 tablet by mouth daily at 12 noon.    [provider]  sertraline (ZOLOFT) 50 MG tablet Take 50 mg by  mouth at bedtime.     [provider]  valACYclovir (VALTREX) 500 MG tablet Take 500 mg by mouth at bedtime.     [provider]  valACYclovir (VALTREX) 500 MG tablet TAKE 1 TABLET BY MOUTH EVERY DAY 12/08/19 12/07/20  Philip Aspen, DO    Allergies    Amoxicillin and Clindamycin/lincomycin  Review of Systems   Review of Systems  Constitutional:  Negative for fever.  HENT:  Negative for ear pain.   Eyes:  Negative for pain.  Respiratory:  Negative for cough.   Cardiovascular:  Negative for chest pain.  Gastrointestinal:  Negative for abdominal pain.  Genitourinary:  Negative for flank pain.  Musculoskeletal:  Negative for back pain.  Skin:  Negative for rash.  Neurological:  Negative for headaches.   Physical Exam Updated Vital Signs BP 121/73 (BP Location: Left Arm)   Pulse 81   Temp 98.2 F (36.8 C) (Oral)   Resp 20   Ht 5\' 5"   (1.651 m)   Wt 71.7 kg   LMP 11/26/2020 (Exact Date)   SpO2 98%   BMI 26.29 kg/m   Physical Exam Constitutional:      General: She is not in acute distress.    Appearance: Normal appearance.  HENT:     Head: Normocephalic.     Nose: Nose normal.  Eyes:     Extraocular Movements: Extraocular movements intact.  Cardiovascular:     Rate and Rhythm: Normal rate.  Pulmonary:     Effort: Pulmonary effort is normal.  Musculoskeletal:        General: Normal range of motion.     Cervical back: Normal range of motion.     Comments: Mild left ankle swelling noted.  Tenderness to the lateral deltoid ligament region.  Otherwise neurovascularly intact lower extremity.  Neurological:     General: No focal deficit present.     Mental Status: She is alert. Mental status is at baseline.    ED Results / Procedures / Treatments   Labs (all labs ordered are listed, but only abnormal results are displayed) Labs Reviewed - No data to display  EKG None  Radiology DG Ankle Complete Left  Result Date: 11/26/2020 CLINICAL DATA:  Twisted ankle 3 days ago EXAM: LEFT ANKLE COMPLETE - 3+ VIEW COMPARISON:  None. FINDINGS: No fracture or dislocation of the left ankle. Joint spaces are preserved. Soft tissue edema over the lateral malleolus. IMPRESSION: No fracture or dislocation of the left ankle. Soft tissue edema over the lateral malleolus. Electronically Signed   By: 11/28/2020 M.D.   On: 11/26/2020 12:53    Procedures Procedures   Medications Ordered in ED Medications - No data to display  ED Course  I have reviewed the triage vital signs and the nursing notes.  Pertinent labs & imaging results that were available during my care of the patient were reviewed by me and considered in my medical decision making (see chart for details).    MDM Rules/Calculators/A&P                           X-rays are negative for fracture.  Patient declined pain medications here.  Given an Ace wrap.   Patient declined crutches.  Recommend outpatient follow-up with her doctor within the week.  Recommend immediate return for worsening pain fevers or additional concerns.  Final Clinical Impression(s) / ED Diagnoses Final diagnoses:  Sprain of left ankle, unspecified ligament, initial encounter  Rx / DC Orders ED Discharge Orders     None        Cheryll Cockayne, MD 11/26/20 1429

## 2020-11-26 NOTE — ED Triage Notes (Signed)
Pt states was running in heels on Friday and injured left ankle. Swelling noted.

## 2021-02-12 DIAGNOSIS — H16142 Punctate keratitis, left eye: Secondary | ICD-10-CM | POA: Diagnosis not present

## 2021-02-12 DIAGNOSIS — H1033 Unspecified acute conjunctivitis, bilateral: Secondary | ICD-10-CM | POA: Diagnosis not present

## 2021-05-28 DIAGNOSIS — M542 Cervicalgia: Secondary | ICD-10-CM | POA: Diagnosis not present

## 2021-05-28 DIAGNOSIS — M546 Pain in thoracic spine: Secondary | ICD-10-CM | POA: Diagnosis not present

## 2021-06-04 DIAGNOSIS — Z01419 Encounter for gynecological examination (general) (routine) without abnormal findings: Secondary | ICD-10-CM | POA: Diagnosis not present

## 2021-06-04 DIAGNOSIS — Z6827 Body mass index (BMI) 27.0-27.9, adult: Secondary | ICD-10-CM | POA: Diagnosis not present

## 2021-10-11 DIAGNOSIS — W57XXXA Bitten or stung by nonvenomous insect and other nonvenomous arthropods, initial encounter: Secondary | ICD-10-CM | POA: Diagnosis not present

## 2021-10-11 DIAGNOSIS — T07XXXA Unspecified multiple injuries, initial encounter: Secondary | ICD-10-CM | POA: Diagnosis not present

## 2022-07-17 DIAGNOSIS — Z01419 Encounter for gynecological examination (general) (routine) without abnormal findings: Secondary | ICD-10-CM | POA: Diagnosis not present

## 2022-07-17 DIAGNOSIS — Z6828 Body mass index (BMI) 28.0-28.9, adult: Secondary | ICD-10-CM | POA: Diagnosis not present

## 2022-07-17 DIAGNOSIS — Z124 Encounter for screening for malignant neoplasm of cervix: Secondary | ICD-10-CM | POA: Diagnosis not present

## 2023-09-22 DIAGNOSIS — M79602 Pain in left arm: Secondary | ICD-10-CM | POA: Diagnosis not present

## 2023-09-22 DIAGNOSIS — M5412 Radiculopathy, cervical region: Secondary | ICD-10-CM | POA: Diagnosis not present

## 2024-01-16 ENCOUNTER — Emergency Department (HOSPITAL_COMMUNITY)
Admission: EM | Admit: 2024-01-16 | Discharge: 2024-01-16 | Discharge status: Against Medical Advice | Attending: Emergency Medicine | Admitting: Emergency Medicine

## 2024-01-16 ENCOUNTER — Other Ambulatory Visit: Payer: Self-pay

## 2024-01-16 ENCOUNTER — Encounter (HOSPITAL_COMMUNITY): Payer: Self-pay | Admitting: Emergency Medicine

## 2024-01-16 DIAGNOSIS — R0981 Nasal congestion: Secondary | ICD-10-CM | POA: Insufficient documentation

## 2024-01-16 DIAGNOSIS — R202 Paresthesia of skin: Secondary | ICD-10-CM | POA: Insufficient documentation

## 2024-01-16 DIAGNOSIS — Z5329 Procedure and treatment not carried out because of patient's decision for other reasons: Secondary | ICD-10-CM | POA: Insufficient documentation

## 2024-01-16 DIAGNOSIS — R55 Syncope and collapse: Secondary | ICD-10-CM | POA: Insufficient documentation

## 2024-01-16 LAB — URINALYSIS, ROUTINE W REFLEX MICROSCOPIC
Bilirubin Urine: NEGATIVE
Glucose, UA: NEGATIVE mg/dL
Ketones, ur: NEGATIVE mg/dL
Leukocytes,Ua: NEGATIVE
Nitrite: NEGATIVE
Protein, ur: NEGATIVE mg/dL
Specific Gravity, Urine: 1.006 (ref 1.005–1.030)
pH: 6 (ref 5.0–8.0)

## 2024-01-16 LAB — CBG MONITORING, ED: Glucose-Capillary: 101 mg/dL — ABNORMAL HIGH (ref 70–99)

## 2024-01-16 LAB — POC URINE PREG, ED: Preg Test, Ur: NEGATIVE

## 2024-01-16 MED ORDER — DIAZEPAM 5 MG/ML IJ SOLN: 5.0000 mg | Freq: Once | INTRAMUSCULAR | Status: DC

## 2024-01-16 MED ORDER — LACTATED RINGERS IV BOLUS: 1000.0000 mL | Freq: Once | INTRAVENOUS | Status: DC

## 2024-01-16 NOTE — ED Triage Notes (Signed)
 PT BIB EMS complains of dizziness an near syncope. Pt used cocaine today and has used the past 3 days. Pt denies n/v

## 2024-01-16 NOTE — ED Notes (Signed)
 Pt left ama after education of risks and benefits.

## 2024-01-16 NOTE — ED Provider Notes (Signed)
 [place] [facility] AT [facility] Provider Note   CSN: [id number] Arrival date & time: 01/16/24  1443     Patient presents with: Dizziness   Donna [name] is a 33 y.o. female.  {Add pertinent medical, surgical, social history, OB history to YEP:67052}  Dizziness Patient presents for near syncope.  She does describe headache and nasal congestion over the past 3 weeks.  Other than that, this morning, she was in her normal state of health.  She does report daily cocaine use for the past 3 days, including today.  She does snore days.  She is today around 12 PM.  Shortly prior to arrival, patient was at her son's basketball game.  She was seated when she experienced dizziness, lightheadedness.  She states that she has never felt like that before.  She has since had intermittent tingling in her arms and legs.  She does endorse ongoing dizziness, in addition to chest tightness.     Prior to Admission medications   Medication Sig Start Date End Date Taking? Authorizing Provider  calcium carbonate (TUMS - DOSED IN MG ELEMENTAL CALCIUM) 500 MG chewable tablet Chew 1 tablet by mouth as needed for indigestion or heartburn (Patient takes up to 6 tablets daily.).    [provider]  chlorhexidine  (HIBICLENS ) 4 % external liquid Apply on the skin daily as needed. 10/07/20   Sponseller, Rebekah R, PA-C  HYDROcodone -acetaminophen  (NORCO/VICODIN) 5-325 MG tablet Take 1-2 tablets by mouth every 4 (four) hours as needed for moderate pain or severe pain. 06/06/16   Pinn, Walda, MD  ibuprofen  (ADVIL ,MOTRIN ) 800 MG tablet Take 1 tablet (800 mg total) by mouth every 8 (eight) hours as needed for cramping. 06/06/16   Pinn, Walda, MD  norethindrone -ethinyl estradiol  1/35 (ORTHO-NOVUM 1/35, 28,) tablet Take 1 tablet by mouth daily. 06/06/16   Pinn, Walda, MD  ondansetron  (ZOFRAN ) 4 MG tablet Take 1 tablet (4 mg total) by mouth every 8 (eight) hours as needed for nausea or vomiting.  09/11/18   Knapp, Iva, MD  Prenatal Vit-Fe Fumarate-FA (PRENATAL MULTIVITAMIN) TABS Take 1 tablet by mouth daily at 12 noon.    [provider]  sertraline (ZOLOFT) 50 MG tablet Take 50 mg by mouth at bedtime.     [provider]  valACYclovir  (VALTREX ) 500 MG tablet Take 500 mg by mouth at bedtime.     [provider]    Allergies: Amoxicillin and Clindamycin /lincomycin    Review of Systems  HENT:  Positive for congestion.   Respiratory:  Positive for chest tightness.   Neurological:  Positive for dizziness and light-headedness.  All other systems reviewed and are negative.   Updated Vital Signs LMP 01/16/2024 (Exact Date)   Physical Exam Vitals and nursing note reviewed.  Constitutional:      General: She is not in acute distress.    Appearance: Normal appearance. She is well-developed. She is not ill-appearing, toxic-appearing or diaphoretic.  HENT:     Head: Normocephalic and atraumatic.     Right Ear: External ear normal.     Left Ear: External ear normal.     Nose: Nose normal.     Mouth/Throat:     Mouth: Mucous membranes are moist.  Eyes:     Extraocular Movements: Extraocular movements intact.     Conjunctiva/sclera: Conjunctivae normal.  Cardiovascular:     Rate and Rhythm: Normal rate and regular rhythm.     Heart sounds: No murmur heard. Pulmonary:  Effort: Pulmonary effort is normal. No respiratory distress.     Breath sounds: No wheezing or rales.  Chest:     Chest wall: No tenderness.  Abdominal:     General: There is no distension.     Palpations: Abdomen is soft.     Tenderness: There is no abdominal tenderness.  Musculoskeletal:        General: Normal range of motion.     Cervical back: Normal range of motion and neck supple.  Skin:    General: Skin is warm and dry.     Capillary Refill: Capillary refill takes less than 2 seconds.  Neurological:     General: No focal deficit present.     Mental Status: She is alert and  oriented to person, place, and time.     Cranial Nerves: No cranial nerve deficit.     Sensory: No sensory deficit.     Motor: No weakness.     Coordination: Coordination normal.  Psychiatric:        Mood and Affect: Mood is anxious. Affect is tearful.        Speech: Speech normal.        Behavior: Behavior normal. Behavior is cooperative.        Thought Content: Thought content normal.     (all labs ordered are listed, but only abnormal results are displayed) Labs Reviewed - No data to display  EKG: None  Radiology: No results found.  {Document cardiac monitor, telemetry assessment procedure when appropriate:32947} Procedures   Medications Ordered in the ED - No data to display    {Click here for ABCD2, HEART and other calculators REFRESH Note before signing:1}                              Medical Decision Making  This patient presents to the ED for concern of ***, this involves an extensive number of treatment options, and is a complaint that carries with it a high risk of complications and morbidity.  The differential diagnosis includes ***   Co morbidities / Chronic conditions that complicate the patient evaluation  ***   Additional history obtained:  Additional history obtained from EMR External records from outside source obtained and reviewed including ***   Lab Tests:  I Ordered, and personally interpreted labs.  The pertinent results include:  ***   Imaging Studies ordered:  I ordered imaging studies including ***  I independently visualized and interpreted imaging which showed *** I agree with the radiologist interpretation   Cardiac Monitoring: / EKG:  The patient was maintained on a cardiac monitor.  I personally viewed and interpreted the cardiac monitored which showed an underlying rhythm of: ***   Problem List / ED Course / Critical interventions / Medication management  Patient resenting for near syncopal episode.  On arrival in the ED,  vital signs notable for moderate hypertension.  She does endorse recent cocaine use, including today.  On exam, she is well-appearing.  She is anxious and tearful.  She endorses ongoing dizziness but has no focal neurologic deficits.  She also endorses chest tightness.  IV fluids and Valium  were ordered.  Workup was initiated.*** I ordered medication including ***   Reevaluation of the patient after these medicines showed that the patient *** I have reviewed the patients home medicines and have made adjustments as needed   Consultations Obtained:  I requested consultation with the ***,  and discussed  lab and imaging findings as well as pertinent plan - they recommend: ***   Social Determinants of Health:  ***   Test / Admission - Considered:  ***   {Document critical care time when appropriate  Document review of labs and clinical decision tools ie CHADS2VASC2, etc  Document your independent review of radiology images and any outside records  Document your discussion with family members, caretakers and with consultants  Document social determinants of health affecting pt's care  Document your decision making why or why not admission, treatments were needed:32947:::1}   Final diagnoses:  None    ED Discharge Orders     None
